# Patient Record
Sex: Male | Born: 1985 | Race: White | Hispanic: No | Marital: Single | Smoking: Current every day smoker
Health system: Southern US, Community
[De-identification: ages and names within clinical notes are randomized; demographics above are authoritative.]

## PROBLEM LIST (undated history)

## (undated) DIAGNOSIS — W3400XA Accidental discharge from unspecified firearms or gun, initial encounter: Secondary | ICD-10-CM

## (undated) DIAGNOSIS — F1110 Opioid abuse, uncomplicated: Secondary | ICD-10-CM

## (undated) DIAGNOSIS — I219 Acute myocardial infarction, unspecified: Secondary | ICD-10-CM

## (undated) DIAGNOSIS — G8929 Other chronic pain: Secondary | ICD-10-CM

## (undated) DIAGNOSIS — F32A Depression, unspecified: Secondary | ICD-10-CM

## (undated) DIAGNOSIS — F419 Anxiety disorder, unspecified: Secondary | ICD-10-CM

## (undated) DIAGNOSIS — M549 Dorsalgia, unspecified: Secondary | ICD-10-CM

## (undated) DIAGNOSIS — S71139A Puncture wound without foreign body, unspecified thigh, initial encounter: Secondary | ICD-10-CM

## (undated) DIAGNOSIS — F329 Major depressive disorder, single episode, unspecified: Secondary | ICD-10-CM

## (undated) HISTORY — PX: LEG SURGERY: SHX1003

## (undated) HISTORY — PX: WRIST SURGERY: SHX841

---

## 2014-03-04 ENCOUNTER — Encounter (HOSPITAL_COMMUNITY): Payer: Self-pay | Admitting: Emergency Medicine

## 2014-03-04 ENCOUNTER — Emergency Department (HOSPITAL_COMMUNITY)
Admission: EM | Admit: 2014-03-04 | Discharge: 2014-03-06 | Disposition: A | Discharge status: Home / Self Care | Payer: Self-pay | Attending: Emergency Medicine | Admitting: Emergency Medicine

## 2014-03-04 ENCOUNTER — Emergency Department (HOSPITAL_COMMUNITY): Payer: Self-pay

## 2014-03-04 DIAGNOSIS — F32A Depression, unspecified: Secondary | ICD-10-CM

## 2014-03-04 DIAGNOSIS — F329 Major depressive disorder, single episode, unspecified: Secondary | ICD-10-CM

## 2014-03-04 DIAGNOSIS — F1120 Opioid dependence, uncomplicated: Secondary | ICD-10-CM

## 2014-03-04 DIAGNOSIS — Z79899 Other long term (current) drug therapy: Secondary | ICD-10-CM | POA: Insufficient documentation

## 2014-03-04 DIAGNOSIS — M549 Dorsalgia, unspecified: Secondary | ICD-10-CM | POA: Insufficient documentation

## 2014-03-04 DIAGNOSIS — F1920 Other psychoactive substance dependence, uncomplicated: Secondary | ICD-10-CM

## 2014-03-04 DIAGNOSIS — G8929 Other chronic pain: Secondary | ICD-10-CM | POA: Insufficient documentation

## 2014-03-04 DIAGNOSIS — R45851 Suicidal ideations: Secondary | ICD-10-CM

## 2014-03-04 DIAGNOSIS — R4589 Other symptoms and signs involving emotional state: Secondary | ICD-10-CM

## 2014-03-04 DIAGNOSIS — F11220 Opioid dependence with intoxication, uncomplicated: Secondary | ICD-10-CM

## 2014-03-04 DIAGNOSIS — F1310 Sedative, hypnotic or anxiolytic abuse, uncomplicated: Secondary | ICD-10-CM

## 2014-03-04 DIAGNOSIS — M542 Cervicalgia: Secondary | ICD-10-CM | POA: Insufficient documentation

## 2014-03-04 DIAGNOSIS — Z720 Tobacco use: Secondary | ICD-10-CM | POA: Insufficient documentation

## 2014-03-04 DIAGNOSIS — R4689 Other symptoms and signs involving appearance and behavior: Secondary | ICD-10-CM

## 2014-03-04 DIAGNOSIS — Z87828 Personal history of other (healed) physical injury and trauma: Secondary | ICD-10-CM | POA: Insufficient documentation

## 2014-03-04 HISTORY — DX: Puncture wound without foreign body, unspecified thigh, initial encounter: S71.139A

## 2014-03-04 HISTORY — DX: Opioid abuse, uncomplicated: F11.10

## 2014-03-04 HISTORY — DX: Dorsalgia, unspecified: M54.9

## 2014-03-04 HISTORY — DX: Accidental discharge from unspecified firearms or gun, initial encounter: W34.00XA

## 2014-03-04 HISTORY — DX: Other chronic pain: G89.29

## 2014-03-04 LAB — CBC WITH DIFFERENTIAL/PLATELET
Basophils Absolute: 0 10*3/uL (ref 0.0–0.1)
Basophils Relative: 0 % (ref 0–1)
EOS PCT: 0 % (ref 0–5)
Eosinophils Absolute: 0 10*3/uL (ref 0.0–0.7)
HCT: 38.5 % — ABNORMAL LOW (ref 39.0–52.0)
HEMOGLOBIN: 13.1 g/dL (ref 13.0–17.0)
LYMPHS ABS: 1.7 10*3/uL (ref 0.7–4.0)
Lymphocytes Relative: 16 % (ref 12–46)
MCH: 30 pg (ref 26.0–34.0)
MCHC: 34 g/dL (ref 30.0–36.0)
MCV: 88.1 fL (ref 78.0–100.0)
MONOS PCT: 4 % (ref 3–12)
Monocytes Absolute: 0.4 10*3/uL (ref 0.1–1.0)
NEUTROS PCT: 80 % — AB (ref 43–77)
Neutro Abs: 8.8 10*3/uL — ABNORMAL HIGH (ref 1.7–7.7)
Platelets: 177 10*3/uL (ref 150–400)
RBC: 4.37 MIL/uL (ref 4.22–5.81)
RDW: 13.6 % (ref 11.5–15.5)
WBC: 11 10*3/uL — ABNORMAL HIGH (ref 4.0–10.5)

## 2014-03-04 LAB — RAPID URINE DRUG SCREEN, HOSP PERFORMED
Amphetamines: NOT DETECTED
BARBITURATES: NOT DETECTED
BENZODIAZEPINES: POSITIVE — AB
Cocaine: NOT DETECTED
Opiates: POSITIVE — AB
TETRAHYDROCANNABINOL: POSITIVE — AB

## 2014-03-04 LAB — COMPREHENSIVE METABOLIC PANEL
ALK PHOS: 69 U/L (ref 39–117)
ALT: 13 U/L (ref 0–53)
AST: 17 U/L (ref 0–37)
Albumin: 4.3 g/dL (ref 3.5–5.2)
Anion gap: 14 (ref 5–15)
BUN: 14 mg/dL (ref 6–23)
CO2: 25 mEq/L (ref 19–32)
Calcium: 9.4 mg/dL (ref 8.4–10.5)
Chloride: 102 mEq/L (ref 96–112)
Creatinine, Ser: 0.74 mg/dL (ref 0.50–1.35)
GLUCOSE: 121 mg/dL — AB (ref 70–99)
POTASSIUM: 4.4 meq/L (ref 3.7–5.3)
Sodium: 141 mEq/L (ref 137–147)
Total Bilirubin: 0.3 mg/dL (ref 0.3–1.2)
Total Protein: 7.1 g/dL (ref 6.0–8.3)

## 2014-03-04 LAB — URINALYSIS, ROUTINE W REFLEX MICROSCOPIC
BILIRUBIN URINE: NEGATIVE
GLUCOSE, UA: NEGATIVE mg/dL
Hgb urine dipstick: NEGATIVE
KETONES UR: NEGATIVE mg/dL
LEUKOCYTES UA: NEGATIVE
Nitrite: NEGATIVE
PROTEIN: NEGATIVE mg/dL
Specific Gravity, Urine: 1.026 (ref 1.005–1.030)
Urobilinogen, UA: 1 mg/dL (ref 0.0–1.0)
pH: 6 (ref 5.0–8.0)

## 2014-03-04 LAB — ETHANOL

## 2014-03-04 LAB — SALICYLATE LEVEL: Salicylate Lvl: <2 mg/dL — ABNORMAL LOW (ref 2.8–20.0)

## 2014-03-04 LAB — ACETAMINOPHEN LEVEL: Acetaminophen (Tylenol), Serum: <15 ug/mL (ref 10–30)

## 2014-03-04 MED ORDER — NICOTINE 21 MG/24HR TD PT24
21.0000 mg | MEDICATED_PATCH | Freq: Every day | TRANSDERMAL | Status: DC
Start: 2014-03-04 — End: 2014-03-06
  Administered 2014-03-04 – 2014-03-06 (×3): 21 mg via TRANSDERMAL
  Filled 2014-03-04 (×3): qty 1

## 2014-03-04 MED ORDER — ONDANSETRON 4 MG PO TBDP: 4.0000 mg | ORAL_TABLET | Freq: Four times a day (QID) | ORAL | Status: DC | PRN

## 2014-03-04 MED ORDER — CHLORDIAZEPOXIDE HCL 25 MG PO CAPS: 25.0000 mg | ORAL_CAPSULE | Freq: Once | ORAL | Status: DC

## 2014-03-04 MED ORDER — CHLORDIAZEPOXIDE HCL 25 MG PO CAPS: 25.0000 mg | ORAL_CAPSULE | Freq: Every day | ORAL | Status: DC

## 2014-03-04 MED ORDER — DICYCLOMINE HCL 20 MG PO TABS: 20.0000 mg | ORAL_TABLET | Freq: Four times a day (QID) | ORAL | Status: DC | PRN

## 2014-03-04 MED ORDER — CHLORDIAZEPOXIDE HCL 25 MG PO CAPS: 25.0000 mg | ORAL_CAPSULE | ORAL | Status: DC

## 2014-03-04 MED ORDER — LOPERAMIDE HCL 2 MG PO CAPS: 2.0000 mg | ORAL_CAPSULE | ORAL | Status: DC | PRN

## 2014-03-04 MED ORDER — CLONIDINE HCL 0.1 MG PO TABS
0.1000 mg | ORAL_TABLET | Freq: Four times a day (QID) | ORAL | Status: AC
  Administered 2014-03-04 – 2014-03-06 (×4): 0.1 mg via ORAL
  Filled 2014-03-04 (×4): qty 1

## 2014-03-04 MED ORDER — CHLORDIAZEPOXIDE HCL 25 MG PO CAPS
25.0000 mg | ORAL_CAPSULE | Freq: Three times a day (TID) | ORAL | Status: AC
  Administered 2014-03-05 – 2014-03-06 (×3): 25 mg via ORAL
  Filled 2014-03-04 (×3): qty 1

## 2014-03-04 MED ORDER — CHLORDIAZEPOXIDE HCL 25 MG PO CAPS
25.0000 mg | ORAL_CAPSULE | Freq: Four times a day (QID) | ORAL | Status: DC | PRN
  Administered 2014-03-06 (×2): 25 mg via ORAL
  Filled 2014-03-04 (×2): qty 1

## 2014-03-04 MED ORDER — IBUPROFEN 200 MG PO TABS
600.0000 mg | ORAL_TABLET | Freq: Three times a day (TID) | ORAL | Status: DC | PRN
  Administered 2014-03-05 – 2014-03-06 (×2): 600 mg via ORAL
  Filled 2014-03-04 (×2): qty 3

## 2014-03-04 MED ORDER — VITAMIN B-1 100 MG PO TABS
100.0000 mg | ORAL_TABLET | Freq: Every day | ORAL | Status: DC
  Administered 2014-03-05 – 2014-03-06 (×2): 100 mg via ORAL
  Filled 2014-03-04 (×2): qty 1

## 2014-03-04 MED ORDER — CLONIDINE HCL 0.1 MG PO TABS: 0.1000 mg | ORAL_TABLET | Freq: Every day | ORAL | Status: DC

## 2014-03-04 MED ORDER — CHLORDIAZEPOXIDE HCL 25 MG PO CAPS
25.0000 mg | ORAL_CAPSULE | Freq: Four times a day (QID) | ORAL | Status: AC
  Administered 2014-03-04 – 2014-03-05 (×4): 25 mg via ORAL
  Filled 2014-03-04 (×4): qty 1

## 2014-03-04 MED ORDER — THIAMINE HCL 100 MG/ML IJ SOLN
100.0000 mg | Freq: Once | INTRAMUSCULAR | Status: AC
  Administered 2014-03-04: 100 mg via INTRAMUSCULAR
  Filled 2014-03-04: qty 2

## 2014-03-04 MED ORDER — CLONIDINE HCL 0.1 MG PO TABS: 0.1000 mg | ORAL_TABLET | ORAL | Status: DC

## 2014-03-04 MED ORDER — METHOCARBAMOL 500 MG PO TABS
500.0000 mg | ORAL_TABLET | Freq: Three times a day (TID) | ORAL | Status: DC | PRN
  Administered 2014-03-05 – 2014-03-06 (×2): 500 mg via ORAL
  Filled 2014-03-04 (×2): qty 1

## 2014-03-04 MED ORDER — ADULT MULTIVITAMIN W/MINERALS CH
1.0000 | ORAL_TABLET | Freq: Every day | ORAL | Status: DC
  Administered 2014-03-04 – 2014-03-06 (×4): 1 via ORAL
  Filled 2014-03-04 (×3): qty 1

## 2014-03-04 NOTE — ED Notes (Signed)
Patient currently speaking with pharmacy staff. Will initiate assessment afterwards.    Colton ColonelGregory Pickett Jr. MSW, LCSW Therapeutic Triage Services-Triage Specialist   Phone: [phone] Fax: [phone]

## 2014-03-04 NOTE — ED Notes (Addendum)
Patient belongings: Pants, phone, socks,  shirt, jacket and wallet in pocket zipped up, slide shoes, ring in urine cup and hat.
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
## 2014-03-04 NOTE — ED Notes (Signed)
Dr Jama Flavorscobos and Catha Nottinghamjamison into see
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
## 2014-03-04 NOTE — ED Notes (Signed)
Up to the bathroom 
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
## 2014-03-04 NOTE — ED Notes (Signed)
[facility] Expected date:  Expected time:  Means of arrival:  Comments: Hold for triage 5
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
## 2014-03-05 ENCOUNTER — Encounter (HOSPITAL_COMMUNITY): Payer: Self-pay | Admitting: Registered Nurse

## 2014-03-05 DIAGNOSIS — F1120 Opioid dependence, uncomplicated: Secondary | ICD-10-CM

## 2014-03-05 DIAGNOSIS — F1320 Sedative, hypnotic or anxiolytic dependence, uncomplicated: Secondary | ICD-10-CM

## 2014-03-05 DIAGNOSIS — F1920 Other psychoactive substance dependence, uncomplicated: Secondary | ICD-10-CM

## 2014-03-05 NOTE — ED Provider Notes (Signed)
Medical screening examination/treatment/procedure(s) were performed by non-physician practitioner and as supervising physician I was immediately available for consultation/collaboration.   EKG Interpretation   Date/Time:  Saturday March 04 2014 09:50:53 EDT Ventricular Rate:  57 PR Interval:  141 QRS Duration: 86 QT Interval:  431 QTC Calculation: 420 R Axis:   86 Text Interpretation:  Sinus rhythm Baseline wander in lead(s) V3 V4 V5 V6  No previous tracing Confirmed by Anitra LauthPLUNKETT  MD, Chaunice Obie ([id number]) on  03/04/2014 10:05:18 AM        Gwyneth SproutWhitney Richel Millspaugh, MD 03/05/14 1601

## 2014-03-05 NOTE — ED Notes (Signed)
Dr Jama Flavorscobos and shuvon into see

## 2014-03-05 NOTE — Progress Notes (Signed)
MHT contacted the following inpatient treatment facilities for placement:  FAXED REFERRALS  1)[facility]  2)[place]  [facility] [facility]  [facility]  1)[facility]  2)[place]  3)[place]  4)[place]  5)[facility]  6)[place]  7)[place]  8)[facility]  9)[facility]    Blain PaisMichelle L Hanad Leino, MHT/NS

## 2014-03-05 NOTE — ED Notes (Addendum)
Up on the phone, pt requesting to speak to the MD in concerning to his detox from the [facility]

## 2014-03-05 NOTE — ED Notes (Addendum)
PO fluids encouraged.  Pt declines medication at this time, instructed to notify staff is abd cramping/diarrhea occurrs.  NP is aware of pt's request to speak to MD/NP concering detox and will see

## 2014-03-05 NOTE — ED Notes (Signed)
Dr Jeraldine LootsLockwood informed of BP/pulse
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
## 2014-03-05 NOTE — ED Notes (Signed)
Patient presents depressed and irritable with blunted affect. Denies any current thoughts of self harm or thoughts to hurt others, denies AVH. NAD

## 2014-03-06 ENCOUNTER — Encounter (HOSPITAL_COMMUNITY): Payer: Self-pay | Admitting: Behavioral Health

## 2014-03-06 ENCOUNTER — Inpatient Hospital Stay (HOSPITAL_COMMUNITY)
Admission: AD | Admit: 2014-03-06 | Discharge: 2014-03-12 | DRG: 885 | Disposition: A | Discharge status: Home / Self Care | Payer: Federal, State, Local not specified - Other | Source: Intra-hospital | Attending: Psychiatry | Admitting: Psychiatry

## 2014-03-06 DIAGNOSIS — R45851 Suicidal ideations: Secondary | ICD-10-CM | POA: Diagnosis present

## 2014-03-06 DIAGNOSIS — G4710 Hypersomnia, unspecified: Secondary | ICD-10-CM | POA: Diagnosis present

## 2014-03-06 DIAGNOSIS — F17210 Nicotine dependence, cigarettes, uncomplicated: Secondary | ICD-10-CM | POA: Diagnosis present

## 2014-03-06 DIAGNOSIS — F1910 Other psychoactive substance abuse, uncomplicated: Secondary | ICD-10-CM | POA: Diagnosis present

## 2014-03-06 DIAGNOSIS — F4310 Post-traumatic stress disorder, unspecified: Secondary | ICD-10-CM

## 2014-03-06 DIAGNOSIS — F1193 Opioid use, unspecified with withdrawal: Secondary | ICD-10-CM

## 2014-03-06 DIAGNOSIS — Z599 Problem related to housing and economic circumstances, unspecified: Secondary | ICD-10-CM

## 2014-03-06 DIAGNOSIS — G4700 Insomnia, unspecified: Secondary | ICD-10-CM | POA: Diagnosis present

## 2014-03-06 DIAGNOSIS — F1290 Cannabis use, unspecified, uncomplicated: Secondary | ICD-10-CM | POA: Diagnosis present

## 2014-03-06 DIAGNOSIS — F410 Panic disorder [episodic paroxysmal anxiety] without agoraphobia: Secondary | ICD-10-CM | POA: Diagnosis present

## 2014-03-06 DIAGNOSIS — F1123 Opioid dependence with withdrawal: Secondary | ICD-10-CM | POA: Diagnosis present

## 2014-03-06 DIAGNOSIS — G8929 Other chronic pain: Secondary | ICD-10-CM | POA: Diagnosis present

## 2014-03-06 DIAGNOSIS — F332 Major depressive disorder, recurrent severe without psychotic features: Secondary | ICD-10-CM | POA: Diagnosis present

## 2014-03-06 DIAGNOSIS — F1994 Other psychoactive substance use, unspecified with psychoactive substance-induced mood disorder: Secondary | ICD-10-CM

## 2014-03-06 DIAGNOSIS — F1320 Sedative, hypnotic or anxiolytic dependence, uncomplicated: Secondary | ICD-10-CM

## 2014-03-06 DIAGNOSIS — F1120 Opioid dependence, uncomplicated: Secondary | ICD-10-CM

## 2014-03-06 DIAGNOSIS — Z569 Unspecified problems related to employment: Secondary | ICD-10-CM | POA: Diagnosis present

## 2014-03-06 MED ORDER — ALUM & MAG HYDROXIDE-SIMETH 200-200-20 MG/5ML PO SUSP: 30.0000 mL | ORAL | Status: DC | PRN

## 2014-03-06 MED ORDER — DICYCLOMINE HCL 20 MG PO TABS
20.0000 mg | ORAL_TABLET | Freq: Four times a day (QID) | ORAL | Status: AC | PRN
  Administered 2014-03-07: 20 mg via ORAL
  Filled 2014-03-06: qty 1

## 2014-03-06 MED ORDER — METHOCARBAMOL 500 MG PO TABS
500.0000 mg | ORAL_TABLET | Freq: Three times a day (TID) | ORAL | Status: DC | PRN
  Administered 2014-03-07 – 2014-03-09 (×5): 500 mg via ORAL
  Filled 2014-03-06 (×5): qty 1

## 2014-03-06 MED ORDER — ACETAMINOPHEN 325 MG PO TABS
650.0000 mg | ORAL_TABLET | Freq: Four times a day (QID) | ORAL | Status: DC | PRN
  Administered 2014-03-09 – 2014-03-10 (×2): 650 mg via ORAL
  Filled 2014-03-06 (×2): qty 2

## 2014-03-06 MED ORDER — ONDANSETRON 4 MG PO TBDP: 4.0000 mg | ORAL_TABLET | Freq: Four times a day (QID) | ORAL | Status: AC | PRN

## 2014-03-06 MED ORDER — NAPROXEN 500 MG PO TABS
500.0000 mg | ORAL_TABLET | Freq: Two times a day (BID) | ORAL | Status: AC | PRN
  Administered 2014-03-06 – 2014-03-11 (×9): 500 mg via ORAL
  Filled 2014-03-06 (×9): qty 1

## 2014-03-06 MED ORDER — DIPHENHYDRAMINE HCL 50 MG PO CAPS
50.0000 mg | ORAL_CAPSULE | Freq: Every evening | ORAL | Status: DC | PRN
  Administered 2014-03-06 – 2014-03-11 (×10): 50 mg via ORAL
  Filled 2014-03-06: qty 2
  Filled 2014-03-06 (×3): qty 1
  Filled 2014-03-06 (×4): qty 2
  Filled 2014-03-06: qty 1
  Filled 2014-03-06: qty 2
  Filled 2014-03-06 (×10): qty 1
  Filled 2014-03-06: qty 2
  Filled 2014-03-06: qty 1

## 2014-03-06 MED ORDER — CLONIDINE HCL 0.1 MG PO TABS
0.1000 mg | ORAL_TABLET | Freq: Every day | ORAL | Status: DC
  Filled 2014-03-06 (×2): qty 1

## 2014-03-06 MED ORDER — ACETAMINOPHEN 325 MG PO TABS: 650.0000 mg | ORAL_TABLET | Freq: Four times a day (QID) | ORAL | Status: DC | PRN

## 2014-03-06 MED ORDER — CHLORDIAZEPOXIDE HCL 25 MG PO CAPS
25.0000 mg | ORAL_CAPSULE | Freq: Four times a day (QID) | ORAL | Status: DC | PRN
  Administered 2014-03-07 – 2014-03-08 (×2): 25 mg via ORAL
  Filled 2014-03-06 (×3): qty 1

## 2014-03-06 MED ORDER — LOPERAMIDE HCL 2 MG PO CAPS
2.0000 mg | ORAL_CAPSULE | ORAL | Status: AC | PRN
  Administered 2014-03-07: 2 mg via ORAL
  Administered 2014-03-07: 4 mg via ORAL
  Filled 2014-03-06: qty 1
  Filled 2014-03-06: qty 2

## 2014-03-06 MED ORDER — CLONIDINE HCL 0.1 MG PO TABS
0.1000 mg | ORAL_TABLET | ORAL | Status: AC
  Administered 2014-03-09 – 2014-03-10 (×2): 0.1 mg via ORAL
  Filled 2014-03-06 (×4): qty 1

## 2014-03-06 MED ORDER — CLONIDINE HCL 0.1 MG PO TABS
0.1000 mg | ORAL_TABLET | Freq: Four times a day (QID) | ORAL | Status: AC
  Administered 2014-03-06 – 2014-03-09 (×9): 0.1 mg via ORAL
  Filled 2014-03-06 (×12): qty 1

## 2014-03-06 MED ORDER — MAGNESIUM HYDROXIDE 400 MG/5ML PO SUSP
30.0000 mL | Freq: Every day | ORAL | Status: DC | PRN
Start: 2014-03-06 — End: 2014-03-12

## 2014-03-06 MED ORDER — MAGNESIUM HYDROXIDE 400 MG/5ML PO SUSP: 30.0000 mL | Freq: Every day | ORAL | Status: DC | PRN

## 2014-03-06 MED ORDER — CLONIDINE HCL 0.1 MG PO TABS: 0.1000 mg | ORAL_TABLET | Freq: Three times a day (TID) | ORAL | Status: DC | PRN

## 2014-03-06 NOTE — ED Notes (Addendum)
States "felt calmer".

## 2014-03-06 NOTE — BH Assessment (Addendum)
[facility] by Jannifer FranklinAkintayo, MD and Renata Capriceonrad, NP. The room assignment is 306-2. [facility].
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
## 2014-03-06 NOTE — BH Assessment (Addendum)
[facility] per Chief Financial OfficerDarlene Nurse coordinator at [facility]. Pt Accepting doctor is Dr.Sands. Pt accepted to bed 2588-2.  Nursing reports telephone number is [phone].   Per Julieanne Cottonina AC [facility] when [facility]. Per Inetta Fermoina there will be potential discharges and a bed should be available after 3pm.  Notified April[facility] to inform her that patient has been placed elsewhere as instructed per [facility] AC.   Colton Kelly has been notified of this.   Glorious PeachNajah Tyrone Balash, MS, LCASA Assessment Counselor

## 2014-03-06 NOTE — ED Notes (Signed)
C/o withdrawal. Librium requested and received.

## 2014-03-06 NOTE — ED Notes (Signed)
[employer] called to inquire if pt is still in department.  RN called at change of shift to inquire about hold up for transport.  Pending [employer] transport.  Pt is IVC.
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
## 2014-03-06 NOTE — ED Notes (Signed)
C/o anxiety. Requested and received librium 25mg .

## 2014-03-06 NOTE — ED Notes (Signed)
[facility] at bedside to transport pt to [facility] 

## 2014-03-06 NOTE — ED Notes (Signed)
[employer] called back to confirm dispatch for transport.  Dispatch confirmed request.

## 2014-03-07 ENCOUNTER — Encounter (HOSPITAL_COMMUNITY): Payer: Self-pay | Admitting: Psychiatry

## 2014-03-07 DIAGNOSIS — F1320 Sedative, hypnotic or anxiolytic dependence, uncomplicated: Secondary | ICD-10-CM | POA: Diagnosis present

## 2014-03-07 DIAGNOSIS — R45851 Suicidal ideations: Secondary | ICD-10-CM

## 2014-03-07 DIAGNOSIS — F332 Major depressive disorder, recurrent severe without psychotic features: Secondary | ICD-10-CM | POA: Diagnosis present

## 2014-03-07 DIAGNOSIS — F4310 Post-traumatic stress disorder, unspecified: Secondary | ICD-10-CM | POA: Diagnosis present

## 2014-03-07 DIAGNOSIS — F1120 Opioid dependence, uncomplicated: Secondary | ICD-10-CM | POA: Diagnosis present

## 2014-03-07 DIAGNOSIS — F1994 Other psychoactive substance use, unspecified with psychoactive substance-induced mood disorder: Secondary | ICD-10-CM

## 2014-03-07 DIAGNOSIS — F1193 Opioid use, unspecified with withdrawal: Secondary | ICD-10-CM | POA: Diagnosis present

## 2014-03-07 MED ORDER — CHLORDIAZEPOXIDE HCL 25 MG PO CAPS: 25.0000 mg | ORAL_CAPSULE | Freq: Three times a day (TID) | ORAL | Status: DC

## 2014-03-07 MED ORDER — ENSURE COMPLETE PO LIQD
237.0000 mL | Freq: Two times a day (BID) | ORAL | Status: DC
  Administered 2014-03-07 – 2014-03-12 (×12): 237 mL via ORAL

## 2014-03-07 MED ORDER — CHLORDIAZEPOXIDE HCL 25 MG PO CAPS
25.0000 mg | ORAL_CAPSULE | Freq: Four times a day (QID) | ORAL | Status: AC
  Administered 2014-03-07 – 2014-03-08 (×4): 25 mg via ORAL
  Filled 2014-03-07 (×4): qty 1

## 2014-03-07 MED ORDER — CHLORDIAZEPOXIDE HCL 25 MG PO CAPS: 25.0000 mg | ORAL_CAPSULE | Freq: Every day | ORAL | Status: DC

## 2014-03-07 MED ORDER — NICOTINE 21 MG/24HR TD PT24
21.0000 mg | MEDICATED_PATCH | Freq: Every day | TRANSDERMAL | Status: DC
  Administered 2014-03-07 – 2014-03-12 (×6): 21 mg via TRANSDERMAL
  Filled 2014-03-07 (×8): qty 1

## 2014-03-07 MED ORDER — ADULT MULTIVITAMIN W/MINERALS CH
1.0000 | ORAL_TABLET | Freq: Every day | ORAL | Status: DC
  Administered 2014-03-07 – 2014-03-12 (×6): 1 via ORAL
  Filled 2014-03-07 (×10): qty 1

## 2014-03-07 MED ORDER — CHLORDIAZEPOXIDE HCL 25 MG PO CAPS: 25.0000 mg | ORAL_CAPSULE | ORAL | Status: DC

## 2014-03-07 MED ORDER — GABAPENTIN 300 MG PO CAPS
300.0000 mg | ORAL_CAPSULE | Freq: Three times a day (TID) | ORAL | Status: DC
  Administered 2014-03-07 – 2014-03-09 (×6): 300 mg via ORAL
  Filled 2014-03-07 (×10): qty 1
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
## 2014-03-07 NOTE — [facility] Group Notes (Signed)
The focus of this group is to educate the patient on the purpose and policies of crisis stabilization and provide a format to answer questions about their admission.  The group details unit policies and expectations of patients while admitted.  Patient did not attend 0900 nurse education orientation group this morning.  Patient stayed in bed.   
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
## 2014-03-07 NOTE — [facility] Group Notes (Signed)
[facility] LCSW Group Therapy  Feelings Around Diagnosis 1:15 -  2:30 PM  03/07/2014 3:00 PM  Type of Therapy:  Group Therapy  Participation Level:  Did Not Attend    Wynn BankerHodnett, Eowyn Tabone Hairston 03/07/2014, 3:00 PM

## 2014-03-07 NOTE — Progress Notes (Signed)
D:Patient in the bed on approach.  Patient state she has been in bed all day.  Patient states he has been having withdrawal symptoms.  Patient states the medications he is on have been helping him.  Patient states he is passive SI but verbally contracts for safety.  Patient denies HI/AVH. A: Staff to monitor Q 15 mins for safety.  Encouragement and support offered.  Scheduled medications administered per orders.  Bentyl and naproxen administered prn R: Patient remains safe on the unit.  Patient did not attend group tonight.  Patient not visible on the unit and not interacting with peers.  Patient taking administered medications.

## 2014-03-07 NOTE — [facility] Group Notes (Signed)
Adult Psychoeducational Group Note  Date:  03/07/2014 Time:  10:19 PM  Group Topic/Focus:  Wrap-Up Group:   The focus of this group is to help patients review their daily goal of treatment and discuss progress on daily workbooks.  Participation Level:  Did Not Attend  Participation Quality:  None  Affect:  None  Cognitive:  None  Insight: None  Engagement in Group:  None  Modes of Intervention:  Discussion  Additional Comments:  Samyak did not attend group.  Caroll RancherLindsay, Demetrice Combes A 03/07/2014, 10:19 PM
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
## 2014-03-07 NOTE — Progress Notes (Addendum)
1130  Patient laying in bed.  Stated he does have SI thoughts, no plan, contracts for safety.  Denied HI.  Denied A/V hallucinations.  Patient stated he talked to SW.  Patient stated earlier this morning that he was not suicidal.  Safety maintained with 15 minute checks.  Respirations even and unlabored.  No signs/symptoms of pain/distress noted on patient's face or body movements.  1445  Patient has been sleeping in bed today.    1530  Patient's self inventory sheet, patient has poor sleep, sleep medication was not helpfl.  Poor appetite, low energy level, poor concentration.  Rated depression, hopeless, anxiety #10.  Has experienced tremors, diarrhea, chilling, craving, irritability in the past 24 hours.  SI occasionally, contracts for safety.  Has experienced body aches in past 24 hours.  Worst pain #10 for body aches/withdrawals.  Goal today is to "feel good".  No discharge plan.  Does not anticipate any problems after discharge.  1700  Patient stated he wants to get better and have a good life with his 30 year old son.  That he has lost his job and now lives in [place].  His girlfriend would drive him to the methadone clinic in [place].  Patient tearful and wants to get lead a productive life for the sake of his small child.  Emotional support and encouragement given patient.

## 2014-03-07 NOTE — Progress Notes (Signed)
NUTRITION ASSESSMENT  Pt identified as at risk on the Malnutrition Screen Tool  INTERVENTION: 1. Educated patient on the importance of nutrition and encouraged intake of food and beverages. 2. Discussed weight goals. 3. Supplements: Provide Ensure Complete po BID, each supplement provides 350 kcal and 13 grams of protein   NUTRITION DIAGNOSIS: Unintentional weight loss related to sub-optimal intake as evidenced by pt report.   Goal: Pt to meet >/= 90% of their estimated nutrition needs.  Monitor:  PO intake  Assessment:  28 y.o. male patient admitted with Severe depression in the context of severe psychosocial stressors and substance dependence.  Pt reports 30 lb weight loss over the last 3 weeks, UBW of 165-170 lb. Current wt is 143 lb (16% wt loss x 3 weeks), this is significant for time frame. Pt states that he has had no appetite for the last couple of weeks. He does not eat regular meals and if he does eat, he only eats a few bites.   Discussed with pt the importance of eating 3 meals a day with snacks, emphasizing protein consumption. Discussed the importance of good nutrition for mental health and aiding in recovery.   Pt is at nutritional risk d/t Hx of alcohol abuse, poor PO intake x 1 month and underweight status.   Height: Ht Readings from Last 1 Encounters:  03/06/14 6\' 4"  (1.93 m)    Weight: Wt Readings from Last 1 Encounters:  03/06/14 143 lb (64.864 kg)    Weight Hx: Wt Readings from Last 10 Encounters:  03/06/14 143 lb (64.864 kg)    BMI:  Body mass index is 17.41 kg/(m^2). Pt meets criteria for underweight based on current BMI.  Estimated Nutritional Needs: Kcal: 25-30 kcal/kg Protein: > 1 gram protein/kg Fluid: 1 ml/kcal  Diet Order: General Pt is also offered choice of unit snacks mid-morning and mid-afternoon.  Pt is eating as desired.   Lab results and medications reviewed.  Glucose 121  Tilda FrancoLindsey [place], [place], [place], [place] Pager: [phone] After  Hours Pager: [phone]
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
## 2014-03-07 NOTE — Progress Notes (Signed)
Recreation Therapy Notes  Animal-Assisted Activity/Therapy (AAA/T) Program Checklist/Progress Notes Patient Eligibility Criteria Checklist & Daily Group note for Rec Tx Intervention  Date: 10.20.2015 Time: 2:45pm Location: [address] Dayroom    AAA/T Program Assumption of Risk Form signed by Patient/ or Parent Legal Guardian yes  Patient is free of allergies or sever asthma yes  Patient reports no fear of animals yes  Patient reports no history of cruelty to animals yes   Patient understands his/her participation is voluntary yes  Behavioral Response: Did not attend.   Marykay Lexenise L Peace Jost, LRT/CTRS  Ludy Messamore L 03/07/2014 4:06 PM

## 2014-03-08 MED ORDER — LORAZEPAM 0.5 MG PO TABS: 0.5000 mg | ORAL_TABLET | Freq: Two times a day (BID) | ORAL | Status: DC

## 2014-03-08 MED ORDER — LORAZEPAM 1 MG PO TABS: 1.0000 mg | ORAL_TABLET | Freq: Four times a day (QID) | ORAL | Status: DC

## 2014-03-08 MED ORDER — LORAZEPAM 1 MG PO TABS
1.0000 mg | ORAL_TABLET | Freq: Four times a day (QID) | ORAL | Status: AC
  Administered 2014-03-08 – 2014-03-09 (×7): 1 mg via ORAL
  Filled 2014-03-08 (×7): qty 1

## 2014-03-08 MED ORDER — LORAZEPAM 1 MG PO TABS
1.0000 mg | ORAL_TABLET | Freq: Four times a day (QID) | ORAL | Status: DC | PRN
  Administered 2014-03-08 – 2014-03-12 (×9): 1 mg via ORAL
  Filled 2014-03-08 (×11): qty 1

## 2014-03-08 MED ORDER — ZOLPIDEM TARTRATE 10 MG PO TABS
10.0000 mg | ORAL_TABLET | Freq: Every evening | ORAL | Status: DC | PRN
  Administered 2014-03-08: 10 mg via ORAL
  Filled 2014-03-08: qty 1

## 2014-03-08 MED ORDER — LORAZEPAM 0.5 MG PO TABS: 0.5000 mg | ORAL_TABLET | Freq: Every day | ORAL | Status: DC

## 2014-03-08 MED ORDER — LORAZEPAM 1 MG PO TABS
1.0000 mg | ORAL_TABLET | Freq: Three times a day (TID) | ORAL | Status: DC
  Administered 2014-03-10 – 2014-03-12 (×8): 1 mg via ORAL
  Filled 2014-03-08 (×6): qty 1

## 2014-03-08 MED ORDER — LORAZEPAM 1 MG PO TABS: 1.0000 mg | ORAL_TABLET | Freq: Two times a day (BID) | ORAL | Status: DC

## 2014-03-08 NOTE — Progress Notes (Signed)
Pt attended NA group this evening.  

## 2014-03-08 NOTE — Progress Notes (Signed)
D: Patient is alert and oriented. Pt's mood is depressed and anxious. Pt's affect is flat. Pt states he feels very badly today and complains of entire body aches. Pt states his goal today is to "get better" by resting and "laying in bed." Pt states he is not attending groups because he feels bad physically and expresses fear of getting a panic attack in group. Pt reports thoughts of SI but denies HI and AVH at this time. Pt reports decreased appetite. Pt reports he wants long term treatment [facility]. A: PRN medication administered per providers orders for pain, muscle spasms, and anxiety. Medications administered per providers orders. 15 minute checks completed per protocol for patient safety. Encouragement provided to pt in regards to attending at least one group today. R: Pt is cooperative and receptive to nursing interventions. Pt verbally contracts for safety.

## 2014-03-08 NOTE — [facility] Group Notes (Signed)
[facility] Dis[facility] Planning [facility] Note   03/08/2014 9:31 AM    Participation Quality:  Did not attend group.  Gyan Cambre, Joesph JulyQuylle Hairston

## 2014-03-08 NOTE — Plan of Care (Signed)
Problem: Alteration in mood; excessive anxiety as evidenced by: Goal: STG-Pt can identify how behaviors/thoughts can (Patient can identify how behaviors/thoughts can increase and decrease anxiety)  Outcome: Progressing Pt reports large groups make him feels anxious and increases his fear of having a panic attack.

## 2014-03-08 NOTE — Progress Notes (Signed)
D: Patient observed laying in bed tearful. Pt reports he is unable to sit up in bed. Pt states "something feels wrong." Pt reports headache and back pain 10/10. Pt appears anxious. Pt reports "seeing black spots." Speech clear, thoughts coherent. A: Vitals taken by RN and stable. Fransisca KaufmannLaura Davis, NP informed of pts current condition and complaints. Reassurance provided to pt. PRN medications administered per providers orders for anxiety and pain. Will continue to monitor closely. R: Pt receptive and cooperative to nursing interventions. Pt laying in bed. Pt states "I know this is withdrawals."

## 2014-03-08 NOTE — Progress Notes (Signed)
[facility]  03/08/2014 12:20 PM Colton LienJohn Kelly  MRN:  [id number] Subjective:  Colton Kelly is having a hard time. He states he did not sleep last night. He "stared at the wall". Still having diarrhea as well as nausea, aches, pains States he is dealing with a lot of anxiety. He has the picture of his son in front of him, states to give him strength to go trough this. Feels that he is dealing with the detox as well as the mood instability and the anxiety he has been dealing on for years Diagnosis:   DSM5: Trauma-Stressor Disorders:  Posttraumatic Stress Disorder (309.81) Substance/Addictive Disorders:  Opioid Disorder - Severe (304.00), Benzodiazepine related disorder Depressive Disorders:  Major Depressive Disorder - Severe (296.23) Total Time spent with patient: 30 minutes  Axis I: Substance Induced Mood Disorder  ADL's:  Intact  Sleep: Poor  Appetite:  Poor Psychiatric Specialty Exam: Physical Exam  Review of Systems  Constitutional: Positive for malaise/fatigue and diaphoresis.  Respiratory: Negative.   Cardiovascular: Negative.   Gastrointestinal: Positive for nausea, abdominal pain and diarrhea.  Genitourinary: Negative.   Musculoskeletal: Positive for back pain, joint pain and myalgias.  Skin: Negative.   Neurological: Positive for weakness and headaches.  Endo/Heme/Allergies: Negative.   Psychiatric/Behavioral: Positive for depression and substance abuse. The patient is nervous/anxious and has insomnia.     Blood pressure 92/51, pulse 59, temperature 98.7 F (37.1 C), temperature source Oral, resp. rate 18, height 6\' 4"  (1.93 m), weight 64.864 kg (143 lb).Body mass index is 17.41 kg/(m^2).  General Appearance: Disheveled, curl on the bed   Eye Contact::  Poor  Speech:  Clear and Coherent  Volume:  Normal  Mood:  Anxious, Depressed and Dysphoric  Affect:  Restricted  Thought Process:  Coherent and Goal Directed  Orientation:  Full (Time, Place, and Person)  Thought  Content:  symtpoms events worries concerns  Suicidal Thoughts:  No  Homicidal Thoughts:  No  Memory:  Immediate;   Fair Recent;   Fair Remote;   Fair  Judgement:  Fair  Insight:  Present  Psychomotor Activity:  Restlessness  Concentration:  Fair  Recall:  FiservFair  Fund of Knowledge:NA  Language: Fair  Akathisia:  No  Handed:    AIMS (if indicated):     Assets:  Desire for Improvement  Sleep:  Number of Hours: 5.75   Musculoskeletal: Strength & Muscle Tone: within normal limits Gait & Station: normal Patient leans: N/A  Current Medications: Current Facility-Administered Medications  Medication Dose Route Frequency Provider Last Rate Last Dose  . acetaminophen (TYLENOL) tablet 650 mg  650 mg Oral Q6H PRN Kerry HoughSpencer E Simon, PA-C      . alum & mag hydroxide-simeth (MAALOX/MYLANTA) 200-200-20 MG/5ML suspension 30 mL  30 mL Oral Q4H PRN Kerry HoughSpencer E Simon, PA-C      . cloNIDine (CATAPRES) tablet 0.1 mg  0.1 mg Oral QID Kerry HoughSpencer E Simon, PA-C   0.1 mg at 03/08/14 [id number]   Followed by  . [START ON 03/09/2014] cloNIDine (CATAPRES) tablet 0.1 mg  0.1 mg Oral BH-qamhs Spencer E Simon, PA-C       Followed by  . [START ON 03/12/2014] cloNIDine (CATAPRES) tablet 0.1 mg  0.1 mg Oral QAC breakfast Kerry HoughSpencer E Simon, PA-C      . dicyclomine (BENTYL) tablet 20 mg  20 mg Oral Q6H PRN Kerry HoughSpencer E Simon, PA-C   20 mg at 03/07/14 2144  . diphenhydrAMINE (BENADRYL) capsule 50 mg  50 mg Oral QHS,MR  X 1 Kerry Hough, PA-C   50 mg at 03/07/14 2311  . feeding supplement (ENSURE COMPLETE) (ENSURE COMPLETE) liquid 237 mL  237 mL Oral BID BM Tilda Franco, RD   237 mL at 03/08/14 0957  . gabapentin (NEURONTIN) capsule 300 mg  300 mg Oral TID Rachael Fee, MD   300 mg at 03/08/14 1149  . loperamide (IMODIUM) capsule 2-4 mg  2-4 mg Oral PRN Kerry Hough, PA-C   2 mg at 03/07/14 2311  . LORazepam (ATIVAN) tablet 1 mg  1 mg Oral QID Rachael Fee, MD   1 mg at 03/08/14 1213   Followed by  . [START ON 03/10/2014]  LORazepam (ATIVAN) tablet 1 mg  1 mg Oral TID Rachael Fee, MD       Followed by  . [START ON 03/13/2014] LORazepam (ATIVAN) tablet 1 mg  1 mg Oral BID Rachael Fee, MD       Followed by  . [START ON 03/16/2014] LORazepam (ATIVAN) tablet 0.5 mg  0.5 mg Oral BID Rachael Fee, MD       Followed by  . [START ON 03/18/2014] LORazepam (ATIVAN) tablet 0.5 mg  0.5 mg Oral Daily Rachael Fee, MD      . LORazepam (ATIVAN) tablet 1 mg  1 mg Oral Q6H PRN Rachael Fee, MD      . magnesium hydroxide (MILK OF MAGNESIA) suspension 30 mL  30 mL Oral Daily PRN Kerry Hough, PA-C      . methocarbamol (ROBAXIN) tablet 500 mg  500 mg Oral Q8H PRN Kerry Hough, PA-C   500 mg at 03/08/14 1610  . multivitamin with minerals tablet 1 tablet  1 tablet Oral Daily Rachael Fee, MD   1 tablet at 03/08/14 0827  . naproxen (NAPROSYN) tablet 500 mg  500 mg Oral BID PRN Kerry Hough, PA-C   500 mg at 03/08/14 0827  . nicotine (NICODERM CQ - dosed in mg/24 hours) patch 21 mg  21 mg Transdermal Daily Rachael Fee, MD   21 mg at 03/08/14 0831  . ondansetron (ZOFRAN-ODT) disintegrating tablet 4 mg  4 mg Oral Q6H PRN Kerry Hough, PA-C      . zolpidem (AMBIEN) tablet 10 mg  10 mg Oral QHS PRN Rachael Fee, MD        Lab Results: No results found for this or any previous visit (from the past 48 hour(s)).  Physical Findings: AIMS: Facial and Oral Movements Muscles of Facial Expression: None, normal Lips and Perioral Area: None, normal Jaw: None, normal Tongue: None, normal,Extremity Movements Upper (arms, wrists, hands, fingers): None, normal Lower (legs, knees, ankles, toes): None, normal, Trunk Movements Neck, shoulders, hips: None, normal, Overall Severity Severity of abnormal movements (highest score from questions above): None, normal Incapacitation due to abnormal movements: None, normal Patient's awareness of abnormal movements (rate only patient's report): No Awareness, Dental Status Current  problems with teeth and/or dentures?: No Does patient usually wear dentures?: No  CIWA:  CIWA-Ar Total: 10 COWS:  COWS Total Score: 8  Treatment Plan Summary: Daily contact with patient to assess and evaluate symptoms and progress in treatment Medication management  Plan: Supportive approach/coping skills/relapse prevention           Trial with Ativan detox with a extended taper            WIl use Ambien that he has found effective (cant take Trazodone)  Continue to assess and address the co morbities  Medical Decision Making Problem Points:  Review of psycho-social stressors (1) Data Points:  Review of medication regiment & side effects (2) Review of new medications or change in dosage (2)  I certify that inpatient services furnished can reasonably be expected to improve the patient's condition.   Diva Lemberger A 03/08/2014, 12:20 PM

## 2014-03-08 NOTE — [facility] Group Notes (Signed)
[facility] LCSW Group Therapy      Emotional Regulation 1:15 - 2:30 PM         03/08/2014 2:48 PM  Type of Therapy:  Group Therapy  Participation Level:  Did Not Attend - patient in bed.  03/08/2014, 2:48 PM

## 2014-03-08 NOTE — Clinical Social Work Note (Signed)
CSW contacted Laren EvertsJan Parrish at [facility] in [place]  She advised there is no funding source available for residential treatment in [place].  She shared they have a five day contract with [facility] for detox.  She also advised of there being a mission center for the homeless in [place] that provide treatment but they only accept new admission four times yearly and they last open for services in August.  She shared there is [facility] in [place], an eight bed program but they generally have a wait list.  She [facility].  CSW explained patient is not homeless but is in need of direct admission into a treatment program.  She shared these are the only options she can recommend.  CSW spoke with patient's mother to see if there were financial resources available to assist with treatment.  She advised of being very supportive and that she pays for patient and his fiance to go to the methadone clinic.

## 2014-03-08 NOTE — [facility] Suicide Risk Assessment (Signed)
[facility] INPATIENT:  Family/Significant Other Suicide Prevention Education  Suicide Prevention Education:  Education Completed; Ricki RodriguezRonda Shelton, Mother, [phone]; has been identified by the patient as the family member/significant other with whom the patient will be residing, and identified as the person(s) who will aid the patient in the event of a mental health crisis (suicidal ideations/suicide attempt).  With written consent from the patient, the family member/significant other has been provided the following suicide prevention education, prior to the and/or following the discharge of the patient.  Mother advised of being very familiar with SPE as her husband, who was veteran, killed himself.  The suicide prevention education provided includes the following:  Suicide risk factors  Suicide prevention and interventions  National Suicide Hotline telephone number  [facility] assessment telephone number  [facility] Emergency Assistance 911  [facility] and/or Residential Mobile Crisis Unit telephone number  Request made of family/significant other to:  Remove weapons (e.g., guns, rifles, knives), all items previously/currently identified as safety concern.   Mother advised there are guns in the home but they are locked in a safe and patient does not have access to the key.   Remove drugs/medications (over-the-counter, prescriptions, illicit drugs), all items previously/currently identified as a safety concern.  The family member/significant other verbalizes understanding of the suicide prevention education information provided.  The family member/significant other agrees to remove the items of safety concern listed above.  Wynn BankerHodnett, Skyler Dusing Hairston 03/08/2014, 2:35 PM

## 2014-03-08 NOTE — Progress Notes (Signed)
D: Patient in the hallway on approach.  Patient state she had a horrible day.  Patient states he continues to to through withdrawals.  Patient states he has not been going to the cafeteria but states he has been eating meals on the unit. Patient did have a period tonight where he appeared drowsy and he stated he did not feel well.  VS were taken and were normal.  Patient was given medications fluids to drink and he stated he felt better.  Patient states he is passive SI but verbally contracts for safety.  Patient denies HI/AVH. A: Staff to monitor Q 15 mins for safety.  Encouragement and support offered.  Scheduled medications administered per orders. Ambien administered prn for sleep. R: Patient remains safe on the unit.  Patient did not attend group tonight.  Patient taking administered medications.  Patient visible on the unit.

## 2014-03-09 LAB — RAPID URINE DRUG SCREEN, HOSP PERFORMED
AMPHETAMINES: NOT DETECTED
BARBITURATES: NOT DETECTED
Benzodiazepines: POSITIVE — AB
Cocaine: NOT DETECTED
Opiates: NOT DETECTED
Tetrahydrocannabinol: POSITIVE — AB

## 2014-03-09 MED ORDER — FLEET ENEMA 7-19 GM/118ML RE ENEM
1.0000 | ENEMA | Freq: Every day | RECTAL | Status: DC | PRN
  Filled 2014-03-09: qty 1

## 2014-03-09 MED ORDER — METHOCARBAMOL 750 MG PO TABS
750.0000 mg | ORAL_TABLET | Freq: Three times a day (TID) | ORAL | Status: AC | PRN
  Administered 2014-03-09 – 2014-03-11 (×5): 750 mg via ORAL
  Filled 2014-03-09 (×6): qty 1

## 2014-03-09 MED ORDER — GABAPENTIN 400 MG PO CAPS
400.0000 mg | ORAL_CAPSULE | Freq: Three times a day (TID) | ORAL | Status: DC
  Administered 2014-03-09 – 2014-03-12 (×10): 400 mg via ORAL
  Filled 2014-03-09 (×16): qty 1

## 2014-03-09 MED ORDER — GABAPENTIN 400 MG PO CAPS: 400.0000 mg | ORAL_CAPSULE | Freq: Three times a day (TID) | ORAL | Status: DC

## 2014-03-09 MED ORDER — METHADONE HCL 10 MG PO TABS
10.0000 mg | ORAL_TABLET | Freq: Two times a day (BID) | ORAL | Status: DC
  Administered 2014-03-09 – 2014-03-10 (×2): 10 mg via ORAL
  Filled 2014-03-09 (×2): qty 1

## 2014-03-09 MED ORDER — MIRTAZAPINE 30 MG PO TABS
30.0000 mg | ORAL_TABLET | Freq: Every day | ORAL | Status: DC
  Administered 2014-03-09 – 2014-03-11 (×3): 30 mg via ORAL
  Filled 2014-03-09 (×5): qty 1

## 2014-03-09 NOTE — Progress Notes (Signed)
Patient told Clinical research associatewriter that he lied to the MHT about his last BM.  Patient states that he wants an enema.  Writer offered patient MOM but patient refused.

## 2014-03-09 NOTE — Progress Notes (Signed)
[facility] Group Notes:  (Nursing/MHT/Case Management/Adjunct)  Date:  03/09/2014  Time:  2100 Type of Therapy:  wrap up group  Participation Level:  Active  Participation Quality:  Appropriate, Attentive, Sharing and Supportive  Affect:  Appropriate  Cognitive:  Appropriate  Insight:  Improving  Engagement in Group:  Engaged  Modes of Intervention:  Clarification, Education and Support  Summary of Progress/Problems: Pt shared that he is wanting to communicate his feelings rather than hold them in. Pt was very verbal in group and reports wanting to get his life back together. Pt shared that he is starting to feel a little better since his methadone was started back. Pt also shared that his relationship with his wife needed a lot of work and both were wiling to do it for themselves and their two year old son.   Shelah LewandowskySquires, Rozina Pointer Carol 03/09/2014, 11:27 PM

## 2014-03-09 NOTE — Progress Notes (Signed)
[facility] MD Progress Note  03/09/2014 5:05 PM Colton Kelly  MRN:  [id number] Subjective:  Colton Kelly is having a very hard time. He is withdrawing and as he comes off the methadone he is having increasing pain. States he is concerned about his ability to stay "clean" if off the methadone he was to experience worsening of his pain. Afraid of relapsing on other opioids mainly heroin. His fiancee is on Methadone too and she has not come off yet. States that he really wants to do this but he is now not sure if he can come off. He is going to move back to where he was living before and will be able to go back to a methadone clinic where he has a counselor he is very closed to. He has been in touch with this counselor who continues to be supportive of him even when he is not a client of his anymore ( it seems he is going to be able to go back to this clinic) Diagnosis:   DSM5: Trauma-Stressor Disorders:  Posttraumatic Stress Disorder (309.81) Substance/Addictive Disorders:  Opioid Disorder - Severe (304.00), Benzodiazepine dependence severe Depressive Disorders:  Major Depressive Disorder - Severe (296.23) Total Time spent with patient: 30 minutes  Axis I: Substance Induced Mood Disorder  ADL's:  Intact  Sleep: Poor  Appetite:  Poor  Psychiatric Specialty Exam: Physical Exam  Review of Systems  HENT: Negative.   Eyes: Negative.   Respiratory: Negative.   Cardiovascular: Negative.   Gastrointestinal: Negative.   Genitourinary: Negative.   Musculoskeletal: Positive for back pain.       Leg pain  Skin: Negative.   Neurological: Positive for weakness.  Endo/Heme/Allergies: Negative.   Psychiatric/Behavioral: Positive for depression and substance abuse. The patient is nervous/anxious and has insomnia.     Blood pressure 99/80, pulse 109, temperature 97.6 F (36.4 C), temperature source Oral, resp. rate 16, height 6\' 4"  (1.93 m), weight 64.864 kg (143 lb), SpO2 100.00%.Body mass index is 17.41  kg/(m^2).  General Appearance: Fairly Groomed  Patent attorney::  Fair  Speech:  Clear and Coherent, Slow and not spontaneous  Volume:  Decreased  Mood:  Anxious and Depressed  Affect:  anxious,worried in pain  Thought Process:  Coherent and Goal Directed  Orientation:  Full (Time, Place, and Person)  Thought Content:  symptoms worries concerns  Suicidal Thoughts:  No  Homicidal Thoughts:  No  Memory:  Immediate;   Fair Recent;   Fair Remote;   Fair  Judgement:  Fair  Insight:  Present  Psychomotor Activity:  Restlessness  Concentration:  Fair  Recall:  [employer] of Knowledge:NA  Language: Fair  Akathisia:  No  Handed:    AIMS (if indicated):     Assets:  Desire for Improvement  Sleep:  Number of Hours: 4.5   Musculoskeletal: Strength & Muscle Tone: within normal limits Gait & Station: normal Patient leans: N/A  Current Medications: Current Facility-Administered Medications  Medication Dose Route Frequency Provider Last Rate Last Dose  . acetaminophen (TYLENOL) tablet 650 mg  650 mg Oral Q6H PRN Kerry Hough, PA-C   650 mg at 03/09/14 1620  . alum & mag hydroxide-simeth (MAALOX/MYLANTA) 200-200-20 MG/5ML suspension 30 mL  30 mL Oral Q4H PRN Kerry Hough, PA-C      . cloNIDine (CATAPRES) tablet 0.1 mg  0.1 mg Oral BH-qamhs Kerry Hough, PA-C       Followed by  . [START ON 03/12/2014] cloNIDine (CATAPRES) tablet 0.1 mg  0.1 mg Oral QAC breakfast Kerry HoughSpencer E Simon, PA-C      . dicyclomine (BENTYL) tablet 20 mg  20 mg Oral Q6H PRN Kerry HoughSpencer E Simon, PA-C   20 mg at 03/07/14 2144  . diphenhydrAMINE (BENADRYL) capsule 50 mg  50 mg Oral QHS,MR X 1 Spencer E Simon, PA-C   50 mg at 03/09/14 0150  . feeding supplement (ENSURE COMPLETE) (ENSURE COMPLETE) liquid 237 mL  237 mL Oral BID BM Tilda FrancoLindsey Baker, RD   237 mL at 03/09/14 1509  . gabapentin (NEURONTIN) capsule 400 mg  400 mg Oral TID Rachael FeeIrving A Asante Ritacco, MD   400 mg at 03/09/14 1304  . loperamide (IMODIUM) capsule 2-4 mg  2-4 mg  Oral PRN Kerry HoughSpencer E Simon, PA-C   2 mg at 03/07/14 2311  . LORazepam (ATIVAN) tablet 1 mg  1 mg Oral QID Rachael FeeIrving A Destyn Schuyler, MD   1 mg at 03/09/14 1304   Followed by  . [START ON 03/10/2014] LORazepam (ATIVAN) tablet 1 mg  1 mg Oral TID Rachael FeeIrving A Deara Bober, MD       Followed by  . [START ON 03/13/2014] LORazepam (ATIVAN) tablet 1 mg  1 mg Oral BID Rachael FeeIrving A Emmit Oriley, MD       Followed by  . [START ON 03/16/2014] LORazepam (ATIVAN) tablet 0.5 mg  0.5 mg Oral BID Rachael FeeIrving A Ivonne Freeburg, MD       Followed by  . [START ON 03/18/2014] LORazepam (ATIVAN) tablet 0.5 mg  0.5 mg Oral Daily Rachael FeeIrving A Dotsie Gillette, MD      . LORazepam (ATIVAN) tablet 1 mg  1 mg Oral Q6H PRN Rachael FeeIrving A Emira Eubanks, MD   1 mg at 03/09/14 1122  . magnesium hydroxide (MILK OF MAGNESIA) suspension 30 mL  30 mL Oral Daily PRN Kerry HoughSpencer E Simon, PA-C      . methocarbamol (ROBAXIN) tablet 750 mg  750 mg Oral Q8H PRN Rachael FeeIrving A Ritchie Klee, MD      . mirtazapine (REMERON) tablet 30 mg  30 mg Oral QHS Rachael FeeIrving A Merelin Human, MD      . multivitamin with minerals tablet 1 tablet  1 tablet Oral Daily Rachael FeeIrving A Alayzia Pavlock, MD   1 tablet at 03/09/14 0810  . naproxen (NAPROSYN) tablet 500 mg  500 mg Oral BID PRN Kerry HoughSpencer E Simon, PA-C   500 mg at 03/09/14 1307  . nicotine (NICODERM CQ - dosed in mg/24 hours) patch 21 mg  21 mg Transdermal Daily Rachael FeeIrving A Elizardo Chilson, MD   21 mg at 03/09/14 0824  . ondansetron (ZOFRAN-ODT) disintegrating tablet 4 mg  4 mg Oral Q6H PRN Kerry HoughSpencer E Simon, PA-C      . sodium phosphate (FLEET) 7-19 GM/118ML enema 1 enema  1 enema Rectal Daily PRN Rachael FeeIrving A Cashay Manganelli, MD        Lab Results: No results found for this or any previous visit (from the past 48 hour(s)).  Physical Findings: AIMS: Facial and Oral Movements Muscles of Facial Expression: None, normal Lips and Perioral Area: None, normal Jaw: None, normal Tongue: None, normal,Extremity Movements Upper (arms, wrists, hands, fingers): None, normal Lower (legs, knees, ankles, toes): None, normal, Trunk Movements Neck, shoulders,  hips: None, normal, Overall Severity Severity of abnormal movements (highest score from questions above): None, normal Incapacitation due to abnormal movements: None, normal Patient's awareness of abnormal movements (rate only patient's report): No Awareness, Dental Status Current problems with teeth and/or dentures?: No Does patient usually wear dentures?: No  CIWA:  CIWA-Ar Total: 6 COWS:  COWS Total Score: 6  Treatment Plan Summary: Daily contact with patient to assess and evaluate symptoms and progress in treatment Medication management  Plan: Supportive approach/coping skills/relapse prevention           Reassess for resuming Methadone   Medical Decision Making Problem Points:  Review of psycho-social stressors (1) Data Points:  Review of medication regiment & side effects (2) Review of new medications or change in dosage (2)  I certify that inpatient services furnished can reasonably be expected to improve the patient's condition.   Edilia Ghuman A 03/09/2014, 5:05 PM

## 2014-03-09 NOTE — Progress Notes (Signed)
D: Patient is alert and oriented. Pts mood is depressed and affect is flat. Pt complains of body aches all over, blurry vision, and insomnia last night. Pt reports passive SI. Pt denies HI and AVH. Pt BP 101/63 standing. Pt observed sitting in the day room interacting with other pts. Pt attending groups. Pt states his goal for the day is to "get better." A: Emotional support given to pt. Pt encouraged to increase fluids. Pt encouraged to go to groups today. PRN medications administered per providers orders for generalized body aches, headache, and anxiety (See MAR). Medications administered per providers orders (See MAR). 15 minute checks completed per protocol for pt safety.  R: Pt cooperative and receptive to nursing interventions. Pt verbally contracts for safety.

## 2014-03-09 NOTE — [facility] Group Notes (Signed)
The focus of this group is to educate the patient on the purpose and policies of crisis stabilization and provide a format to answer questions about their admission.  The group details unit policies and expectations of patients while admitted.  Patient did not attend 0900 nurse education orientation group this morning.  Patient stayed in bed.   

## 2014-03-09 NOTE — Plan of Care (Signed)
Problem: Ineffective individual coping Goal: STG: Pt will be able to identify effective and ineffective STG: Pt will be able to identify effective and ineffective coping patterns  Outcome: Progressing Pt encouraged to go to groups. Pt receptive of encouragement. Pt spending more time interacting in the day room. Goal: STG: Patient will remain free from self harm Outcome: Progressing 15 minute checks completed per protocol for pt safety. Pt verbally contracts for safety. Goal: STG: Patient will participate in after care plan Patient will attend attend groups and engage in discussion. Outpatient follow up appointment will be scheduled.  Colton Kelly.Quylle Hodnett, LCSW 03/07/2014 3:19 PM  Outcome: Progressing Pt expressing desires to speak with social worker about after care plans.  Problem: Diagnosis: Increased Risk For Suicide Attempt Goal: STG-Patient Will Report Suicidal Feelings to Staff Outcome: Progressing Pt verbally contracts for safety.

## 2014-03-10 MED ORDER — METHADONE HCL 10 MG PO TABS
10.0000 mg | ORAL_TABLET | Freq: Three times a day (TID) | ORAL | Status: DC
  Administered 2014-03-10 – 2014-03-12 (×9): 10 mg via ORAL
  Filled 2014-03-10 (×9): qty 1

## 2014-03-10 NOTE — Progress Notes (Signed)
D) Pt continues to have a lot of physical aches and pains r/t withdrawal. Stares he is slowly starting to feel better because of the Methadone begin increased. Pt comes to the window frequently for PRN's. States that he was able to go and eat this afternoon for the first time in a long time. "I was even hungry today". Pt rates his depression at a 9, his hopelessness at an 8 and his anxiety at a 10.  A) Pt given support, reassurance and praise. Provided with education about detoxing and what medications he has to help him. Encouragement given. R) Pt denies SI and HI.

## 2014-03-10 NOTE — [facility] Group Notes (Signed)
[facility]   03/10/2014 12:07 PM    Participation Quality:  Appropraite  Mood/Affect:  Appropriate  Depression Rating:  8  Anxiety Rating:  10  Thoughts of Suicide:  No  Will you contract for safety?   NA  Current AVH:  No  Plan for Discharge/Comments:  Patient attended discharge planning group and actively participated in group. He advised of anxiety being high due to being off Methadone and feels he will be better once Methadone is in place.  Suiicide prevention education reviewed and SPE document provided.   Transportation Means: Patient has transportation.   Supports:  Patient has a support system.   Colton Kelly, Colton Kelly

## 2014-03-10 NOTE — Progress Notes (Signed)
D. Pt has been up and visible in milieu this evening, able to attend and participate in milieu activities. Pt appears restless and fidgety while in the milieu, pt endorsing difficulties with his withdrawal but did state that he feels slightly better now that he is back on methadone. Pt did receive medications without incident. A. Support and encouragement provided. R. Safety maintained, will continue to monitor.

## 2014-03-10 NOTE — [facility] Group Notes (Signed)
Adult Psychoeducational Group Note  Date:  03/10/2014 Time:  11:28 PM  Group Topic/Focus:  AA Meeting  Participation Level:  Active  Participation Quality:  Appropriate  Affect:  Appropriate  Cognitive:  Appropriate  Insight: Appropriate  Engagement in Group:  Engaged  Modes of Intervention:  Discussion and Education  Additional Comments:  Jonny RuizJohn was very engaged in group.  He explained that he can't afford to "keep messing up".  Caroll RancherLindsay, Brianna Esson A 03/10/2014, 11:28 PM

## 2014-03-10 NOTE — Progress Notes (Signed)
[facility]  03/10/2014 2:14 PM Colton Kelly  MRN:  [id number] Subjective:  Colton Kelly is still endorsing the aches and pains. He was started back on the Methadone. He has arranged for follow up in the previous methadone clinic. He states he is hopeful that a lower dose of methadone than the one he was using (120 mg) will help with the pain as well as the withdrawal, the cravings. States he does not want to relapse. He is committed to do whatever he needs to do not to go back to using Diagnosis:   DSM5: Trauma-Stressor Disorders:  Posttraumatic Stress Disorder (309.81) Substance/Addictive Disorders:  Opioid Disorder - Severe (304.00) Depressive Disorders:  Major Depressive Disorder - Severe (296.23) Total Time spent with patient: 30 minutes  Axis I: Substance Induced Mood Disorder  ADL's:  Intact  Sleep: Poor  Appetite:  improving  Psychiatric Specialty Exam: Physical Exam  Review of Systems  Constitutional: Positive for malaise/fatigue.  HENT: Negative.   Eyes: Negative.   Respiratory: Negative.   Cardiovascular: Negative.   Gastrointestinal: Negative.   Genitourinary: Negative.   Musculoskeletal: Positive for back pain, joint pain and myalgias.  Skin: Negative.   Neurological: Positive for weakness.  Endo/Heme/Allergies: Negative.   Psychiatric/Behavioral: Positive for depression and substance abuse. The patient has insomnia.     Blood pressure 99/66, pulse 90, temperature 98.7 F (37.1 C), temperature source Oral, resp. rate 16, height 6\' 4"  (1.93 m), weight 64.864 kg (143 lb), SpO2 100.00%.Body mass index is 17.41 kg/(m^2).  General Appearance: Fairly Groomed  Patent attorneyye Contact::  Fair  Speech:  Clear and Coherent  Volume:  Decreased  Mood:  Anxious and Depressed  Affect:  Restricted  Thought Process:  Coherent and Goal Directed  Orientation:  Full (Time, Place, and Person)  Thought Content:  symptoms events worries concerns  Suicidal Thoughts:  No  Homicidal  Thoughts:  No  Memory:  Immediate;   Fair Recent;   Fair Remote;   Fair  Judgement:  Fair  Insight:  Present  Psychomotor Activity:  Restlessness  Concentration:  Fair  Recall:  [employer]Fair  Fund of Knowledge:NA  Language: Fair  Akathisia:  No  Handed:    AIMS (if indicated):     Assets:  Desire for Improvement Housing Social Support  Sleep:  Number of Hours: 4   Musculoskeletal: Strength & Muscle Tone: within normal limits Gait & Station: normal Patient leans: N/A  Current Medications: Current Facility-Administered Medications  Medication Dose Route Frequency Provider Last Rate Last Dose  . acetaminophen (TYLENOL) tablet 650 mg  650 mg Oral Q6H PRN Kerry HoughSpencer E Simon, PA-C   650 mg at 03/09/14 1620  . alum & mag hydroxide-simeth (MAALOX/MYLANTA) 200-200-20 MG/5ML suspension 30 mL  30 mL Oral Q4H PRN Kerry HoughSpencer E Simon, PA-C      . cloNIDine (CATAPRES) tablet 0.1 mg  0.1 mg Oral BH-qamhs Spencer E Simon, PA-C   0.1 mg at 03/10/14 0809   Followed by  . [START ON 03/12/2014] cloNIDine (CATAPRES) tablet 0.1 mg  0.1 mg Oral QAC breakfast Kerry HoughSpencer E Simon, PA-C      . dicyclomine (BENTYL) tablet 20 mg  20 mg Oral Q6H PRN Kerry HoughSpencer E Simon, PA-C   20 mg at 03/07/14 2144  . diphenhydrAMINE (BENADRYL) capsule 50 mg  50 mg Oral QHS,MR X 1 Kerry HoughSpencer E Simon, PA-C   50 mg at 03/09/14 2307  . feeding supplement (ENSURE COMPLETE) (ENSURE COMPLETE) liquid 237 mL  237 mL Oral BID BM Mardella LaymanLindsey  Baker, RD   237 mL at 03/10/14 1313  . gabapentin (NEURONTIN) capsule 400 mg  400 mg Oral TID Rachael FeeIrving A Taleeya Blondin, MD   400 mg at 03/10/14 1219  . loperamide (IMODIUM) capsule 2-4 mg  2-4 mg Oral PRN Kerry HoughSpencer E Simon, PA-C   2 mg at 03/07/14 2311  . LORazepam (ATIVAN) tablet 1 mg  1 mg Oral TID Rachael FeeIrving A Drayton Tieu, MD   1 mg at 03/10/14 1115   Followed by  . [START ON 03/13/2014] LORazepam (ATIVAN) tablet 1 mg  1 mg Oral BID Rachael FeeIrving A Perl Folmar, MD       Followed by  . [START ON 03/16/2014] LORazepam (ATIVAN) tablet 0.5 mg  0.5 mg Oral BID  Rachael FeeIrving A Brees Hounshell, MD       Followed by  . [START ON 03/18/2014] LORazepam (ATIVAN) tablet 0.5 mg  0.5 mg Oral Daily Rachael FeeIrving A Jariel Drost, MD      . LORazepam (ATIVAN) tablet 1 mg  1 mg Oral Q6H PRN Rachael FeeIrving A Harshini Trent, MD   1 mg at 03/10/14 1310  . magnesium hydroxide (MILK OF MAGNESIA) suspension 30 mL  30 mL Oral Daily PRN Kerry HoughSpencer E Simon, PA-C      . methadone (DOLOPHINE) tablet 10 mg  10 mg Oral TID PC & HS Rachael FeeIrving A Tria Noguera, MD   10 mg at 03/10/14 1219  . methocarbamol (ROBAXIN) tablet 750 mg  750 mg Oral Q8H PRN Rachael FeeIrving A Aslee Such, MD   750 mg at 03/10/14 1310  . mirtazapine (REMERON) tablet 30 mg  30 mg Oral QHS Rachael FeeIrving A Emani Taussig, MD   30 mg at 03/09/14 2132  . multivitamin with minerals tablet 1 tablet  1 tablet Oral Daily Rachael FeeIrving A Kimberleigh Mehan, MD   1 tablet at 03/10/14 0809  . naproxen (NAPROSYN) tablet 500 mg  500 mg Oral BID PRN Kerry HoughSpencer E Simon, PA-C   500 mg at 03/10/14 1310  . nicotine (NICODERM CQ - dosed in mg/24 hours) patch 21 mg  21 mg Transdermal Daily Rachael FeeIrving A Bay Wayson, MD   21 mg at 03/10/14 0810  . ondansetron (ZOFRAN-ODT) disintegrating tablet 4 mg  4 mg Oral Q6H PRN Kerry HoughSpencer E Simon, PA-C      . sodium phosphate (FLEET) 7-19 GM/118ML enema 1 enema  1 enema Rectal Daily PRN Rachael FeeIrving A Danayah Smyre, MD        Lab Results:  Results for orders placed during the hospital encounter of 03/06/14 (from the past 48 hour(s))  URINE RAPID DRUG SCREEN (HOSP PERFORMED)     Status: Abnormal   Collection Time    03/09/14  1:18 PM      Result Value Ref Range   Opiates NONE DETECTED  NONE DETECTED   Cocaine NONE DETECTED  NONE DETECTED   Benzodiazepines POSITIVE (*) NONE DETECTED   Amphetamines NONE DETECTED  NONE DETECTED   Tetrahydrocannabinol POSITIVE (*) NONE DETECTED   Barbiturates NONE DETECTED  NONE DETECTED   Comment:            DRUG SCREEN FOR MEDICAL PURPOSES     ONLY.  IF CONFIRMATION IS NEEDED     FOR ANY PURPOSE, NOTIFY LAB     WITHIN 5 DAYS.                LOWEST DETECTABLE LIMITS     FOR URINE DRUG SCREEN      Drug Class       Cutoff (ng/mL)     Amphetamine  1000     Barbiturate      200     Benzodiazepine   200     Tricyclics       300     Opiates          300     Cocaine          300     THC              50     Performed at [facility]    Physical Findings: AIMS: Facial and Oral Movements Muscles of Facial Expression: None, normal Lips and Perioral Area: None, normal Jaw: None, normal Tongue: None, normal,Extremity Movements Upper (arms, wrists, hands, fingers): None, normal Lower (legs, knees, ankles, toes): None, normal, Trunk Movements Neck, shoulders, hips: None, normal, Overall Severity Severity of abnormal movements (highest score from questions above): None, normal Incapacitation due to abnormal movements: None, normal Patient's awareness of abnormal movements (rate only patient's report): No Awareness, Dental Status Current problems with teeth and/or dentures?: No Does patient usually wear dentures?: No  CIWA:  CIWA-Ar Total: 5 COWS:  COWS Total Score: 6  Treatment Plan Summary: Daily contact with patient to assess and evaluate symptoms and progress in treatment Medication management  Plan: Supportive approach/coping skills/relapse prevention           Optimize response to methadone (lowest effective dose)           Continue to address the co morbidities)  Medical Decision Making Problem Points:  Review of psycho-social stressors (1) Data Points:  Review of medication regiment & side effects (2) Review of new medications or change in dosage (2)  I certify that inpatient services furnished can reasonably be expected to improve the patient's condition.   Zafirah Vanzee A 03/10/2014, 2:14 PM

## 2014-03-10 NOTE — [facility] Group Notes (Signed)
[facility] LCSW Group Therapy  Feelings Around Relapse 1:15 -2:30        03/10/2014  3:21 PM   Type of Therapy:  Group Therapy  Participation Level:  Appropriate  Participation Quality:  Appropriate  Affect:  Appropriate  Cognitive:  Attentive Appropriate  Insight:  Developing/Improving  Engagement in Therapy: Developing/Improving  Modes of Intervention:  Discussion Exploration Problem-Solving Supportive  Summary of Progress/Problems:  The topic for today was feelings around relapse. Patient processed feelings toward relapse and was able to relate to peers.  He shared relapsing for him would be returning to drug use.  He shared it has been five years since he last used heroin but is on Methadone.  Patient shared he also smokes THC.   Patient identified coping skills that can be used to prevent a relapse including acknowledging THC is a drug that altered his mood/mind.  Patient shared he has to get his life together for his two year old son.  Patient shared he was raised within a father and he does not want that for his son.   Wynn BankerHodnett, Colton Kelly 03/10/2014 3:21 PM

## 2014-03-10 NOTE — Tx Team (Signed)
Interdisciplinary Treatment Plan Update   Date Reviewed:  03/10/2014  Time Reviewed:  9:52 AM  Progress in Treatment:   Attending groups: Yes Participating in groups: Yes Taking medication as prescribed: Yes  Tolerating medication: Yes Family/Significant other contact made:  Yes, collateral contact with mother Patient understands diagnosis: Yes  Discussing patient identified problems/goals with staff: Yes Medical problems stabilized or resolved: Yes Denies suicidal/homicidal ideation: Yes Patient has not harmed self or others: Yes  For review of initial/current patient goals, please see plan of care.  Estimated Length of Stay:  3-5 days  Reasons for Continued Hospitalization:  Anxiety Depression Medication stabilization  New Problems/Goals identified:    Discharge Plan or Barriers:   Home with [facility] [place], [place]  Additional Comments:  Continue medication stabilization.    Patient and CSW reviewed patient's identified goals and treatment plan.  Patient verbalized understanding and agreed to treatment plan.   Attendees:  Patient:  03/10/2014 9:52 AM   Signature:  Sallyanne HaversF. Cobos, MD 03/10/2014 9:52 AM  Signature: Geoffery LyonsIrving Lugo, MD 03/10/2014 9:52 AM  Signature: Robbie LouisVivian Kent , RN 03/10/2014 9:52 AM  Signature, Genelle GatherPatti Dukes,  RN 03/10/2014 9:52 AM  Signature:  Lamount Crankerhris Judge, RN 03/10/2014 9:52 AM  Signature:  Juline PatchQuylle Parul Porcelli, LCSW 03/10/2014 9:52 AM  Signature:  Belenda CruiseKristin Drinkard, LCSW-A 03/10/2014 9:52 AM  Signature:  Leisa LenzValerie Enoch, Care Coordinator [facility] 03/10/2014 9:52 AM  Signature:  Jon Gillselora Sutton, [facility] Transition Team 03/10/2014 9:52 AM  Signature:  03/10/2014  9:52 AM  Signature:    RN URCM 03/10/2014  9:52 AM  Signature:   03/10/2014  9:52 AM    Scribe for Treatment Team:   Juline PatchQuylle Lajoy Vanamburg,  03/10/2014 9:52 AM

## 2014-03-10 NOTE — Progress Notes (Signed)
[facility] Group Notes:  (Nursing/MHT/Case Management/Adjunct)  Date:  03/10/2014  Time:  1:05 PM  Type of Therapy:  Therapeutic Activity  Participation Level:  Active  Participation Quality:  Appropriate  Affect:  Appropriate   Euretha Najarro C 03/10/2014, 1:05 PM 

## 2014-03-10 NOTE — [facility] Group Notes (Signed)
[facility] LCSW Group Therapy  Living A Balanced Life  1:15 - 2: 30          03/10/2014   Late Entry for 03/09/14   Type of Therapy:  Group Therapy  Participation Level:  Appropriate  Participation Quality:  Appropriate  Affect:  Depressed, Tearful  Cognitive:  Attentive Appropriate  Insight: Developing/Improving  Engagement in Therapy:  Developing/Improving  Modes of Intervention:  Discussion Exploration Problem-Solving Supportive   Summary of Progress/Problems: Topic for group was Living a Balanced Life.  Patient was able to show how life has become unbalanced.   Patient shared his life has been out of balance due to years of drug use.  Patient was very tearful as he shared five of his friends have died of overdosing over the past six months.  Patient able to  Identify appropriate coping skills including staying on methadone until he can get into a residential program.   Wynn BankerHodnett, Ludean Duhart Hairston 03/10/2014

## 2014-03-11 NOTE — [facility] Group Notes (Signed)
[facility] Group Notes:  (Clinical Social Work)  03/11/2014     10-11AM  Summary of Progress/Problems:   The main focus of today's process group was to learn how to use a decisional balance exercise to move forward in the Stages of Change, which were described and discussed.  Motivational Interviewing and a worksheet were utilized to help patients explore in depth the perceived benefits and costs of a self-sabotaging behavior, as well as the  benefits and costs of replacing that with a healthy coping mechanism.   The patient expressed that he is in the hospital due to suicidal ideation, depression, bipolar disorder.  He stated he self-sabotages by keeping in his emotions, instead of letting them out, and he eventually explodes.  He initially stated he is in Action stage of change, but was able to identify after some discussion that he actually is in Preparation Stage of Change, that he needs to take significant time to prepare more plans for his recovery.  Type of Therapy:  Group Therapy - Process   Participation Level:  Active  Participation Quality:  Attentive, Sharing and Supportive  Affect:  Appropriate and Blunted  Cognitive:  Alert and Appropriate  Insight:  Engaged  Engagement in Therapy:  Engaged  Modes of Intervention:  Education, Motivational Interviewing  Ambrose MantleMareida Grossman-Orr, LCSW 03/11/2014, 1:48 PM

## 2014-03-11 NOTE — Progress Notes (Signed)
Patient did attend the evening speaker AA meeting.  

## 2014-03-11 NOTE — [facility] Group Notes (Signed)
[facility] Group Notes:  (Nursing/MHT/Case Management/Adjunct)  Date:  03/11/2014  Time:  1:54 PM  Type of Therapy:  Psychoeducational Skills-Pt self inventory group  Participation Level:  Active  Participation Quality:  Appropriate  Affect:  Appropriate  Cognitive:  Appropriate  Insight:  Appropriate  Engagement in Group:  Engaged  Modes of Intervention:  Discussion  Summary of Progress/Problems: Pt did attend self inventory group.   Parilee Hally Shanta 03/11/2014, 1:54 PM 

## 2014-03-11 NOTE — Progress Notes (Signed)
D) Pt has been attending the groups and interacting with his peers. Jokes around a lot and tends to not take things too seriously. Was sitting in the dayroom this afternoon and was crying while on the phone. States, "I was talking about my son who is saying he misses me very much and it is just hard for me". Seems to be doing much better since the increase of the methadone yesterday. Not asking for as many prn's today A) Given support, reassurance and praise along with encouragement. Provided with a 1:1. Therapeutic humor used with Pt. R) Pt denies SI and HI. Rates his depression at a 7, his hopelessness at a 6 and his anxiety at a 10. Denies SI and HI.

## 2014-03-11 NOTE — Progress Notes (Signed)
D. Pt has been up and has been active while in the milieu, attending and participating in various activities. Pt has spoken about having generalized achiness but reports it is better now that he is slowly getting back on methadone. Pt has endorsed depression and anxiety today and reports that he will be here through the weekend and spoke about his discharge plans that are in place for next week. A. Support and encouragement provided, medication education provided. R. Pt verbalized understanding, safety maintained, will continue to monitor.

## 2014-03-11 NOTE — Progress Notes (Signed)
Patient ID: Colton Kelly, male   DOB: [birth year], 28 y.o.   MRN: [id number] [facility] MD Progress Note  03/11/2014 2:34 PM Colton Kelly  MRN:  [id number] Subjective:  Patient was seen participating in group.  Patient states he is feeling much better since his admission.  Patient reports that starting back on Methadone at a lower dose is helping.  He has agreed to try Cymbalta for pain and depression because he strongly believes that he is depressed.  Written information was handed to patient for the use and actions of Cymbalta.  Patient will talk to this Clinical research associate tomorrow about starting Cymbalta.  He reports better sleep and appetite since coming in here.  Patient states he will do his best to stay on treatment after his discharge from here.  He denies SI/HI/AVH.  We will continue to monitor patient, provide safety and offer his medications. 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  HENT: Negative.   Eyes: Negative.   Respiratory: Negative.   Cardiovascular: Negative.   Gastrointestinal: Negative.   Genitourinary: Negative.   Musculoskeletal: Positive for back pain, joint pain and myalgias.  Skin: Negative.   Neurological: Positive for weakness.  Endo/Heme/Allergies: Negative.   Psychiatric/Behavioral: Positive for depression and substance abuse. The patient has insomnia.     Blood pressure 151/72, pulse 64, temperature 97.7 F (36.5 C), temperature source Oral, resp. rate 16, height 6\' 4"  (1.93 m), weight 64.864 kg (143 lb), SpO2 100.00%.Body mass index is 17.41  kg/(m^2).  General Appearance: Fairly Groomed  Patent attorney::  Fair  Speech:  Clear and Coherent  Volume:  Decreased  Mood:  Anxious and Depressed  Affect:  Restricted  Thought Process:  Coherent and Goal Directed  Orientation:  Full (Time, Place, and Person)  Thought Content:  symptoms events worries concerns  Suicidal Thoughts:  No  Homicidal Thoughts:  No  Memory:  Immediate;   Fair Recent;   Fair Remote;   Fair  Judgement:  Fair  Insight:  Present  Psychomotor Activity:  Normal, calm  Concentration:  Fair  Recall:  [employer] of Knowledge:NA  Language: Fair  Akathisia:  No  Handed:    AIMS (if indicated):     Assets:  Desire for Improvement Housing Social Support  Sleep:  Number of Hours: 6   Musculoskeletal: Strength & Muscle Tone: within normal limits Gait & Station: normal Patient leans: N/A  Current Medications: Current Facility-Administered Medications  Medication Dose Route Frequency Provider Last Rate Last Dose  . acetaminophen (TYLENOL) tablet 650 mg  650 mg Oral Q6H PRN Kerry Hough, PA-C   650 mg at 03/10/14 1519  . alum & mag hydroxide-simeth (MAALOX/MYLANTA) 200-200-20 MG/5ML suspension 30 mL  30 mL Oral Q4H PRN Kerry Hough, PA-C      . cloNIDine (CATAPRES) tablet 0.1 mg  0.1 mg Oral BH-qamhs Spencer E Simon, PA-C   0.1 mg at 03/10/14 0809   Followed by  . [START ON 03/12/2014] cloNIDine (CATAPRES) tablet 0.1 mg  0.1 mg Oral QAC breakfast Kerry Hough, PA-C      . dicyclomine (BENTYL) tablet 20 mg  20 mg Oral  Q6H PRN Kerry HoughSpencer E Simon, PA-C   20 mg at 03/07/14 2144  . diphenhydrAMINE (BENADRYL) capsule 50 mg  50 mg Oral QHS,MR X 1 Kerry HoughSpencer E Simon, PA-C   50 mg at 03/10/14 2259  . feeding supplement (ENSURE COMPLETE) (ENSURE COMPLETE) liquid 237 mL  237 mL Oral BID BM Tilda FrancoLindsey Baker, RD   237 mL at 03/11/14 1000  . gabapentin (NEURONTIN) capsule 400 mg  400 mg Oral TID Rachael FeeIrving A Lugo, MD   400 mg at 03/11/14 1137  . loperamide (IMODIUM) capsule 2-4  mg  2-4 mg Oral PRN Kerry HoughSpencer E Simon, PA-C   2 mg at 03/07/14 2311  . LORazepam (ATIVAN) tablet 1 mg  1 mg Oral TID Rachael FeeIrving A Lugo, MD   1 mg at 03/11/14 1124   Followed by  . [START ON 03/13/2014] LORazepam (ATIVAN) tablet 1 mg  1 mg Oral BID Rachael FeeIrving A Lugo, MD       Followed by  . [START ON 03/16/2014] LORazepam (ATIVAN) tablet 0.5 mg  0.5 mg Oral BID Rachael FeeIrving A Lugo, MD       Followed by  . [START ON 03/18/2014] LORazepam (ATIVAN) tablet 0.5 mg  0.5 mg Oral Daily Rachael FeeIrving A Lugo, MD      . LORazepam (ATIVAN) tablet 1 mg  1 mg Oral Q6H PRN Rachael FeeIrving A Lugo, MD   1 mg at 03/11/14 0605  . magnesium hydroxide (MILK OF MAGNESIA) suspension 30 mL  30 mL Oral Daily PRN Kerry HoughSpencer E Simon, PA-C      . methadone (DOLOPHINE) tablet 10 mg  10 mg Oral TID PC & HS Rachael FeeIrving A Lugo, MD   10 mg at 03/11/14 [id number]  . methocarbamol (ROBAXIN) tablet 750 mg  750 mg Oral Q8H PRN Rachael FeeIrving A Lugo, MD   750 mg at 03/11/14 1124  . mirtazapine (REMERON) tablet 30 mg  30 mg Oral QHS Rachael FeeIrving A Lugo, MD   30 mg at 03/10/14 2123  . multivitamin with minerals tablet 1 tablet  1 tablet Oral Daily Rachael FeeIrving A Lugo, MD   1 tablet at 03/11/14 [id number]  . naproxen (NAPROSYN) tablet 500 mg  500 mg Oral BID PRN Kerry HoughSpencer E Simon, PA-C   500 mg at 03/10/14 1310  . nicotine (NICODERM CQ - dosed in mg/24 hours) patch 21 mg  21 mg Transdermal Daily Rachael FeeIrving A Lugo, MD   21 mg at 03/11/14 0836  . ondansetron (ZOFRAN-ODT) disintegrating tablet 4 mg  4 mg Oral Q6H PRN Kerry HoughSpencer E Simon, PA-C      . sodium phosphate (FLEET) 7-19 GM/118ML enema 1 enema  1 enema Rectal Daily PRN Rachael FeeIrving A Lugo, MD        Lab Results:  No results found for this or any previous visit (from the past 48 hour(s)).  Physical Findings: AIMS: Facial and Oral Movements Muscles of Facial Expression: None, normal Lips and Perioral Area: None, normal Jaw: None, normal Tongue: None, normal,Extremity Movements Upper (arms, wrists, hands, fingers): None, normal Lower (legs, knees, ankles, toes):  None, normal, Trunk Movements Neck, shoulders, hips: None, normal, Overall Severity Severity of abnormal movements (highest score from questions above): None, normal Incapacitation due to abnormal movements: None, normal Patient's awareness of abnormal movements (rate only patient's report): No Awareness, Dental Status Current problems with teeth and/or dentures?: No Does patient usually wear dentures?: No  CIWA:  CIWA-Ar Total: 1 COWS:  COWS Total Score: 6  Treatment Plan Summary: Daily contact with patient to assess and evaluate symptoms  and progress in treatment Medication management  Plan: Continue with plan of care Continue crisis management Encourage to participate in group and individual sessions Continue medication management/ and review as needed We are using our clonidine protocol for his Opioid detox as needed We are also using 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 Discharge  Plan in progress Address health issues /V/S as needed     Medical Decision Making Problem Points:  Review of psycho-social stressors (1) Data Points:  Review of medication regiment & side effects (2) Review of new medications or change in dosage (2)  I certify that inpatient services furnished can reasonably be expected to improve the patient's condition.   Dahlia ByesONUOHA, JOSEPHINE, C     PMHNP-BC 03/11/2014, 2:34 PM I agreed with findings and treatment plan of this patient

## 2014-03-12 MED ORDER — MIRTAZAPINE 30 MG PO TABS: 30.0000 mg | ORAL_TABLET | Freq: Every day | ORAL | Status: DC

## 2014-03-12 MED ORDER — GABAPENTIN 400 MG PO CAPS: 400.0000 mg | ORAL_CAPSULE | Freq: Three times a day (TID) | ORAL | Status: DC

## 2014-03-12 NOTE — Progress Notes (Signed)
D. Pt has been up and has been active in milieu this evening, has attended and participated in evening group activity. Pt spoke about feeling a little better today, spoke about how he is hopeful to be discharged on Monday but does still report anxiety and has requested PRN medications to help with his anxiety. Pt spoke about wanting to be clean so he can be a better father to his son. A. Support and encouragement provided. R. Safety maintained, will continue to monitor.

## 2014-03-12 NOTE — Progress Notes (Signed)
Patient denies SI/HI, denies A/V hallucinations. Patient verbalizes understanding of discharge instructions, follow up care and prescriptions. Patient [facility] including hat, wallet, methadone clinic card, nicorette card, cigarettes, cigarette lighter, cell phone, keys, marble. Patient escorted out by staff, transported by family.

## 2014-03-12 NOTE — Progress Notes (Signed)
[facility] :  Will you be returning to the same living situation after discharge: Yes,  home with wife At discharge, do you have transportation home?:Yes,  with family Do you have the ability to pay for your medications:Yes,  discussed with weekday CSW  Release of information consent forms completed and in the chart;  Patient's signature needed at discharge.  Patient to Follow up at: Follow-up Information   [facility] On 03/14/2014. (Please go to the clinic between 5AM - 8AM for new patient assessment on Tuesday, March 14, 2014)    Contact information:   [address] [place], [place]   [id number]  [phone]      [facility] On 03/15/2014. (Please go to [facility]'s walk in clinic on Wednesday, March 15, 2014 or any Monday - Thursday between 8AM-3PM or on Friday from 8AM-1PM)    Contact information:   [address] [place], [place]   [id number]  [phone]      Patient denies SI/HI:   Yes,  denies    Safety Planning and Suicide Prevention discussed:  Yes,  per notes  Sarina SerGrossman-Orr, Jase Himmelberger Jo 03/12/2014, 2:59 PM

## 2014-03-12 NOTE — Progress Notes (Signed)
Notified by [facility] (MW ) on 300 hall this morning that " your patient is smoking again in his room". This nurse walked into patient's room and upon entering his room, around 0800,  there is a visible screen of  cigarette smoke hanging in the air. This nurse asked pt  if he had any matches and he replied     "   No. I flushed them" He makes additional comments about not liking [facility], he feels like we show favoritism to other  Patients by being nicer to those patients . When this nurse pointed out to him that he had been observed coloring penises on the floor of the 300 hall medication room , he stated " well, what do you expect..I  am sooooo bored here".  This nurse spoke with pt about need for him to show he is engaged in his own recovery...that he needs  to be at the center of his treatment team , involved in the decisions affecting his life and that he needs to be compliant while in this facility and coloring vulgarity and smoking cigarettes repeatedly in his room ( in a no smoking facility!!!!) are not behaviors indicative of compliance and engaged in recovery.RN encouraged pt to address his issues..To allow staff to process with him and to help him deal with his drug issues.
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
## 2014-03-12 NOTE — Progress Notes (Signed)
D) Pt. Observed to be verbally loud with peers, singing "snitches get stiches" during AM med pass at med window. Pt. was caught by charge RN smoking in his room. Charge RN was called by [facility] staff who smelled smoke coming from pt's room. Per charge RN there was a "wall of smoke" upon entering pt's room. Pt. did tell RN that he flushed the cigarette in the toilet when asked. Mr. Colton Kelly has been med compliant thus far this shift, however, he refused his scheduled AM dose of Clonidine when offered. Presents with a pleasant /congruent affect this AM when approached by Clinical research associatewriter.  A) All meds given as ordered. Support and encouragement offered towards treatment goals. Safety needs of self and others in relation to facility rules and treatment contract (No smoking on campus) was reviewed with pt. by Clinical research associatewriter. Q 15 minutes checks continues for detox / withdrawal symptoms and pt. is progressing well without injuries. R) Pt. Took his meds this AM. Attended groups as scheduled. Verbalized understanding during safety discussion and facility rules. Remains safe while hospitalized.
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
## 2014-03-12 NOTE — B[facility] Group Notes (Signed)
B[facility] Group Notes:  (Clinical Social Work)  03/12/2014  10:00-11:00AM  Summary of Progress/Problems:   The main focus of today's process group was to   1)  discuss the importance of adding supports  2)  define health supports versus unhealthy supports  3)  identify the patient's current unhealthy supports and plan how to handle them  4)  Identify the patient's current healthy supports and plan what to add.  An emphasis was placed on using counselor, doctor, therapy groups, 12-step groups, and problem-specific support groups to expand supports.    The patient expressed full comprehension of the concepts presented, and agreed that there is a need to add more supports.  The patient stated that his wife and 2yo son are his healthy supports, while his main unhealthy support in his life is himself.  He interacted frequently and well within group until he was called out to be seen by doctor and Nurse Practitioner, was out for awhile.  He abruptly returned to the room with slamming the door open which made a large bang and startled CSW and group members, as well as staff outside the day room.  A number of staff ran to aid, and he stated to CSW he was not angry, was calm while saying this.  He did get called back out to talk about the incident with the Administrative Coordinator.  A good deal of time and effort was needed to reestablish order in the group, as well as an overall feeling of safety in the group.  Type of Therapy:  Process Group with Motivational Interviewing  Participation Level:  Active  Participation Quality:  Appropriate, Attentive, Intrusive, Sharing and Supportive  Affect:  Angry and Blunted  Cognitive:  Oriented  Insight:  Limited  Engagement in Therapy:  Engaged  Modes of Intervention:   Education, Support and Processing, Activity  Pilgrim's PrideMareida Grossman-Orr, LCSW 03/12/2014, 12:15pm

## 2014-03-15 NOTE — Progress Notes (Addendum)
Patient Discharge Instructions:  After Visit Summary (AVS):   Faxed to:  03/15/14 Discharge Summary Note:   Faxed to:  03/15/14 Psychiatric Admission Assessment Note:   Faxed to:  03/15/14 Suicide Risk Assessment - Discharge Assessment:   Faxed to:  03/15/14 Faxed/Sent to the Next Level Care provider:  03/15/14 Faxed to [facility] @ [phone] Faxed to [facility] @ [phone]  Jerelene ReddenSheena E [place], 03/15/2014, 3:48 PM

## 2014-12-19 ENCOUNTER — Emergency Department (HOSPITAL_COMMUNITY)
Admission: EM | Admit: 2014-12-19 | Discharge: 2014-12-19 | Disposition: A | Discharge status: Other Acute Inpatient Hospital | Payer: Self-pay | Attending: Emergency Medicine | Admitting: Emergency Medicine

## 2014-12-19 ENCOUNTER — Encounter (HOSPITAL_COMMUNITY): Payer: Self-pay

## 2014-12-19 ENCOUNTER — Encounter (HOSPITAL_COMMUNITY): Payer: Self-pay | Admitting: General Practice

## 2014-12-19 ENCOUNTER — Observation Stay (HOSPITAL_COMMUNITY)
Admission: AD | Admit: 2014-12-19 | Discharge: 2014-12-20 | Disposition: A | Discharge status: Home / Self Care | Payer: Federal, State, Local not specified - Other | Source: Intra-hospital | Attending: Psychiatry | Admitting: Psychiatry

## 2014-12-19 DIAGNOSIS — F1120 Opioid dependence, uncomplicated: Secondary | ICD-10-CM | POA: Diagnosis present

## 2014-12-19 DIAGNOSIS — F1994 Other psychoactive substance use, unspecified with psychoactive substance-induced mood disorder: Secondary | ICD-10-CM | POA: Diagnosis not present

## 2014-12-19 DIAGNOSIS — Z720 Tobacco use: Secondary | ICD-10-CM | POA: Insufficient documentation

## 2014-12-19 DIAGNOSIS — R251 Tremor, unspecified: Secondary | ICD-10-CM | POA: Insufficient documentation

## 2014-12-19 DIAGNOSIS — F419 Anxiety disorder, unspecified: Secondary | ICD-10-CM | POA: Insufficient documentation

## 2014-12-19 DIAGNOSIS — Z888 Allergy status to other drugs, medicaments and biological substances status: Secondary | ICD-10-CM | POA: Insufficient documentation

## 2014-12-19 DIAGNOSIS — F17210 Nicotine dependence, cigarettes, uncomplicated: Secondary | ICD-10-CM | POA: Insufficient documentation

## 2014-12-19 DIAGNOSIS — F4310 Post-traumatic stress disorder, unspecified: Secondary | ICD-10-CM | POA: Diagnosis not present

## 2014-12-19 DIAGNOSIS — Z79899 Other long term (current) drug therapy: Secondary | ICD-10-CM | POA: Insufficient documentation

## 2014-12-19 DIAGNOSIS — F329 Major depressive disorder, single episode, unspecified: Secondary | ICD-10-CM | POA: Insufficient documentation

## 2014-12-19 DIAGNOSIS — G8929 Other chronic pain: Secondary | ICD-10-CM | POA: Insufficient documentation

## 2014-12-19 DIAGNOSIS — F1114 Opioid abuse with opioid-induced mood disorder: Principal | ICD-10-CM | POA: Insufficient documentation

## 2014-12-19 DIAGNOSIS — R000 Tachycardia, unspecified: Secondary | ICD-10-CM | POA: Insufficient documentation

## 2014-12-19 HISTORY — DX: Anxiety disorder, unspecified: F41.9

## 2014-12-19 HISTORY — DX: Depression, unspecified: F32.A

## 2014-12-19 HISTORY — DX: Acute myocardial infarction, unspecified: I21.9

## 2014-12-19 HISTORY — DX: Major depressive disorder, single episode, unspecified: F32.9

## 2014-12-19 LAB — COMPREHENSIVE METABOLIC PANEL
ALT: 15 U/L — ABNORMAL LOW (ref 17–63)
ANION GAP: 7 (ref 5–15)
AST: 17 U/L (ref 15–41)
Albumin: 4 g/dL (ref 3.5–5.0)
Alkaline Phosphatase: 62 U/L (ref 38–126)
BUN: 9 mg/dL (ref 6–20)
CALCIUM: 8.9 mg/dL (ref 8.9–10.3)
CHLORIDE: 108 mmol/L (ref 101–111)
CO2: 26 mmol/L (ref 22–32)
CREATININE: 0.7 mg/dL (ref 0.61–1.24)
GFR calc Af Amer: >60 mL/min (ref >60)
Glucose, Bld: 93 mg/dL (ref 65–99)
POTASSIUM: 3.5 mmol/L (ref 3.5–5.1)
Sodium: 141 mmol/L (ref 135–145)
TOTAL PROTEIN: 6.3 g/dL — AB (ref 6.5–8.1)
Total Bilirubin: 0.3 mg/dL (ref 0.3–1.2)

## 2014-12-19 LAB — URINALYSIS, ROUTINE W REFLEX MICROSCOPIC
BILIRUBIN URINE: NEGATIVE
Glucose, UA: NEGATIVE mg/dL
Hgb urine dipstick: NEGATIVE
KETONES UR: NEGATIVE mg/dL
Leukocytes, UA: NEGATIVE
NITRITE: NEGATIVE
Protein, ur: NEGATIVE mg/dL
Specific Gravity, Urine: 1.007 (ref 1.005–1.030)
Urobilinogen, UA: 0.2 mg/dL (ref 0.0–1.0)
pH: 6.5 (ref 5.0–8.0)

## 2014-12-19 LAB — RAPID URINE DRUG SCREEN, HOSP PERFORMED
Amphetamines: NOT DETECTED
BARBITURATES: NOT DETECTED
BENZODIAZEPINES: NOT DETECTED
COCAINE: NOT DETECTED
OPIATES: NOT DETECTED
Tetrahydrocannabinol: NOT DETECTED

## 2014-12-19 LAB — CBC
HCT: 41.1 % (ref 39.0–52.0)
HEMOGLOBIN: 14.1 g/dL (ref 13.0–17.0)
MCH: 30.5 pg (ref 26.0–34.0)
MCHC: 34.3 g/dL (ref 30.0–36.0)
MCV: 89 fL (ref 78.0–100.0)
Platelets: 301 10*3/uL (ref 150–400)
RBC: 4.62 MIL/uL (ref 4.22–5.81)
RDW: 13.3 % (ref 11.5–15.5)
WBC: 11.4 10*3/uL — ABNORMAL HIGH (ref 4.0–10.5)

## 2014-12-19 LAB — CBG MONITORING, ED: Glucose-Capillary: 89 mg/dL (ref 65–99)

## 2014-12-19 MED ORDER — ALUM & MAG HYDROXIDE-SIMETH 200-200-20 MG/5ML PO SUSP: 30.0000 mL | ORAL | Status: DC | PRN

## 2014-12-19 MED ORDER — LORAZEPAM 1 MG PO TABS
2.0000 mg | ORAL_TABLET | Freq: Four times a day (QID) | ORAL | Status: DC | PRN
  Administered 2014-12-19 – 2014-12-20 (×2): 2 mg via ORAL
  Filled 2014-12-19 (×2): qty 2

## 2014-12-19 MED ORDER — MIRTAZAPINE 15 MG PO TABS: 30.0000 mg | ORAL_TABLET | Freq: Every day | ORAL | Status: DC

## 2014-12-19 MED ORDER — GABAPENTIN 400 MG PO CAPS: 400.0000 mg | ORAL_CAPSULE | Freq: Three times a day (TID) | ORAL | Status: DC

## 2014-12-19 MED ORDER — ZOLPIDEM TARTRATE 5 MG PO TABS: 5.0000 mg | ORAL_TABLET | Freq: Every evening | ORAL | Status: DC | PRN

## 2014-12-19 MED ORDER — IBUPROFEN 200 MG PO TABS: 600.0000 mg | ORAL_TABLET | Freq: Three times a day (TID) | ORAL | Status: DC | PRN

## 2014-12-19 MED ORDER — CARBAMAZEPINE 200 MG PO TABS
200.0000 mg | ORAL_TABLET | Freq: Three times a day (TID) | ORAL | Status: DC
  Administered 2014-12-19 – 2014-12-20 (×2): 200 mg via ORAL
  Filled 2014-12-19 (×2): qty 1

## 2014-12-19 MED ORDER — DIPHENHYDRAMINE HCL 50 MG PO CAPS
50.0000 mg | ORAL_CAPSULE | Freq: Every evening | ORAL | Status: DC | PRN
  Administered 2014-12-19: 50 mg via ORAL
  Filled 2014-12-19: qty 1

## 2014-12-19 MED ORDER — LORAZEPAM 2 MG/ML IJ SOLN
1.0000 mg | INTRAMUSCULAR | Status: DC | PRN
  Administered 2014-12-19: 1 mg via INTRAVENOUS
  Filled 2014-12-19: qty 1

## 2014-12-19 MED ORDER — ACETAMINOPHEN 325 MG PO TABS: 650.0000 mg | ORAL_TABLET | Freq: Four times a day (QID) | ORAL | Status: DC | PRN

## 2014-12-19 MED ORDER — ONDANSETRON HCL 4 MG PO TABS: 4.0000 mg | ORAL_TABLET | Freq: Three times a day (TID) | ORAL | Status: DC | PRN

## 2014-12-19 MED ORDER — NICOTINE 21 MG/24HR TD PT24
21.0000 mg | MEDICATED_PATCH | Freq: Every day | TRANSDERMAL | Status: DC
Start: 2014-12-19 — End: 2014-12-19

## 2014-12-19 MED ORDER — DIPHENHYDRAMINE HCL 50 MG PO CAPS: 50.0000 mg | ORAL_CAPSULE | Freq: Every evening | ORAL | Status: DC | PRN

## 2014-12-19 MED ORDER — LORAZEPAM 1 MG PO TABS
1.0000 mg | ORAL_TABLET | Freq: Three times a day (TID) | ORAL | Status: DC | PRN
  Administered 2014-12-19: 1 mg via ORAL
  Filled 2014-12-19: qty 1

## 2014-12-19 MED ORDER — MAGNESIUM HYDROXIDE 400 MG/5ML PO SUSP: 30.0000 mL | Freq: Every day | ORAL | Status: DC | PRN

## 2014-12-19 NOTE — ED Notes (Signed)
Per Baxter Hire, [facility], pt accepted to [facility] - may be transported at 2030.

## 2014-12-19 NOTE — ED Notes (Signed)
[place], MD at bedside 
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
## 2014-12-19 NOTE — ED Notes (Signed)
Pt changed into paper scrubs - belongings placed in 1 labeled bag.
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
## 2014-12-19 NOTE — BH Assessment (Signed)
[facility] Assessment Progress Note    Called and scheduled this pt's tele assessment appointment with this clinician.  Attempted to call EDP Radford Pax, but he was in with another pt.  Will call after assessment.  Casimer Lanius, MS, [facility] Therapeutic Triage Specialist [facility]

## 2014-12-19 NOTE — ED Notes (Addendum)
Pt on phone at nurses' desk talking w/spouse. Pt voiced understanding of [facility] policies.

## 2014-12-19 NOTE — ED Notes (Signed)
[facility] called to transport pt 

## 2014-12-19 NOTE — ED Notes (Signed)
Per [place], [facility], pt is not SI/HI and is possibly being accepted to [facility] after 1900 this evening. Pt given blue scrubs to change into - aware of tx plan.
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
## 2014-12-20 LAB — TSH: TSH: 1.192 u[IU]/mL (ref 0.350–4.500)

## 2014-12-20 LAB — CARBAMAZEPINE LEVEL, TOTAL: CARBAMAZEPINE LVL: 9.8 ug/mL (ref 4.0–12.0)

## 2014-12-20 MED ORDER — METHADONE HCL 10 MG PO TABS
100.0000 mg | ORAL_TABLET | Freq: Every day | ORAL | Status: DC
  Administered 2014-12-20: 100 mg via ORAL
  Filled 2014-12-20: qty 10

## 2014-12-20 MED ORDER — CARBAMAZEPINE 200 MG PO TABS: 200.0000 mg | ORAL_TABLET | Freq: Three times a day (TID) | ORAL | Status: AC

## 2014-12-20 MED ORDER — NICOTINE 21 MG/24HR TD PT24: 21.0000 mg | MEDICATED_PATCH | Freq: Every day | TRANSDERMAL | Status: AC

## 2014-12-20 MED ORDER — METHADONE HCL 10 MG/ML PO CONC: 100.0000 mg | Freq: Every day | ORAL | Status: AC

## 2014-12-20 MED ORDER — NICOTINE 21 MG/24HR TD PT24
21.0000 mg | MEDICATED_PATCH | Freq: Every day | TRANSDERMAL | Status: DC
  Administered 2014-12-20: 21 mg via TRANSDERMAL
  Filled 2014-12-20: qty 1

## 2014-12-20 NOTE — Progress Notes (Signed)
D/C instructions/meds/follow-up appointments reviewed, pt verbalized understanding, pt's belongings returned to pt, samples given. 

## 2014-12-20 NOTE — [facility] Counselor (Signed)
This counselor spoke with pt in regards to his admission here. Pt shared that he recently signed out AMA from a [facility] after reportedly 5 days of Psychosis after inhaling a blunt laced with Meth,cocaine and bath salts. Pt states he has a history of opoid and heroin addiction and states that he wants help. Pt shared that he would like to receive inpatient treatment and this was also recommenced by the P.A. On call. Pt shared that he has crying spells and often feels worthless. Pt shared that he is not suicidal, but continues to hear whispers and see shadowy figures. Pt would greatly benefit from receiving inpatient treatment services.  Ardelle Park, MA OBS Counselor

## 2014-12-20 NOTE — Plan of Care (Signed)
[facility] Observation Crisis Plan  Reason for Crisis Plan:  Crisis Stabilization   Plan of Care:  Referral for Inpatient Hospitalization  Family Support:    spouse  Current Living Environment:  Living Arrangements: Spouse/significant other, Children  Insurance:   Hospital Account    Name Acct ID Class Status Primary Coverage   Colton Kelly, Colton Kelly [id number] BEHAVIORAL HEALTH OBSERVATION [facility] FOR [facility]/DD/SAS - [place] [address]        Guarantor Account (for Hospital Account [id number])    Name Relation to Pt Service Area Active? Acct Type   Colton Kelly Self [facility] Yes Behavioral Health   Address Phone       [address] [place], [place] [address] [phone](H)          Coverage Information (for Hospital Account [id number])    F/O Payor/Plan Precert #   [facility] FOR MH/DD/SAS/[place] [address]    Subscriber Subscriber #   Colton Kelly    Address Phone   [address] [place], [place] [address] [phone]      Legal Guardian:   none  Primary Care Provider:  No PCP Per Patient  Current Outpatient Providers:  [facility] treatment center  Psychiatrist:     Counselor/Therapist:     Compliant with Medications:  Yes  Additional Information:   [name] 8/3/20165:17 AM

## 2014-12-20 NOTE — Discharge Instructions (Signed)
For your continued mental health maintenance, it is recommended that you follow up as soon as possible with any of the below providers:  [facility] [address] [place], [place] [address] [phone] Walk-in hours are Monday - Friday from 8:00 am - 3:00 pm.    [facility]) [address] [place], [place] [address]  [phone] Walk in hours are Monday - Friday 8:30am - 12:00pm and 1:00pm - 230pm
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
## 2014-12-20 NOTE — Progress Notes (Signed)
[facility] INPATIENT:  Family/Significant Other Suicide Prevention Education  Suicide Prevention Education:  Patient Refusal for Family/Significant Other Suicide Prevention Education: The patient Colton Kelly has refused to provide written consent for family/significant other to be provided Family/Significant Other Suicide Prevention Education during admission and/or prior to discharge.  Physician notified. Patient not currently suicidal but staff is meeting with wife in the am.  [name] 12/20/2014, 12:01 AM

## 2014-12-20 NOTE — Progress Notes (Signed)
Pt. was seen this date by this writer who assisted with discharge planning while patient was in the observational unit. Pt. had presented earlier with symptoms associated with substance abuse where patient stated that he had used what he thought was Cannabis but contained what he believed to be other additives. Pt. was at the time of this assessment was stable and denied any S/I or H/I and stated he was ready to return to his residence. This Probation officer contacted the patients wife who met with patient and this Probation officer to discuss patient follow up. It was suggested by this Probation officer that the individual should address his SA issues and gave him contact information for [facility]. This Probation officer contacted [employer] and gave the patient the times which he could be seen and what services they provided.

## 2016-07-12 IMAGING — CR DG CHEST 2V
2 series · 2 of 2 positions shown · non-contrast
Comparison: None.

CLINICAL DATA: Suicidal ideation.

EXAM:
CHEST  2 VIEW

[w chest pa]
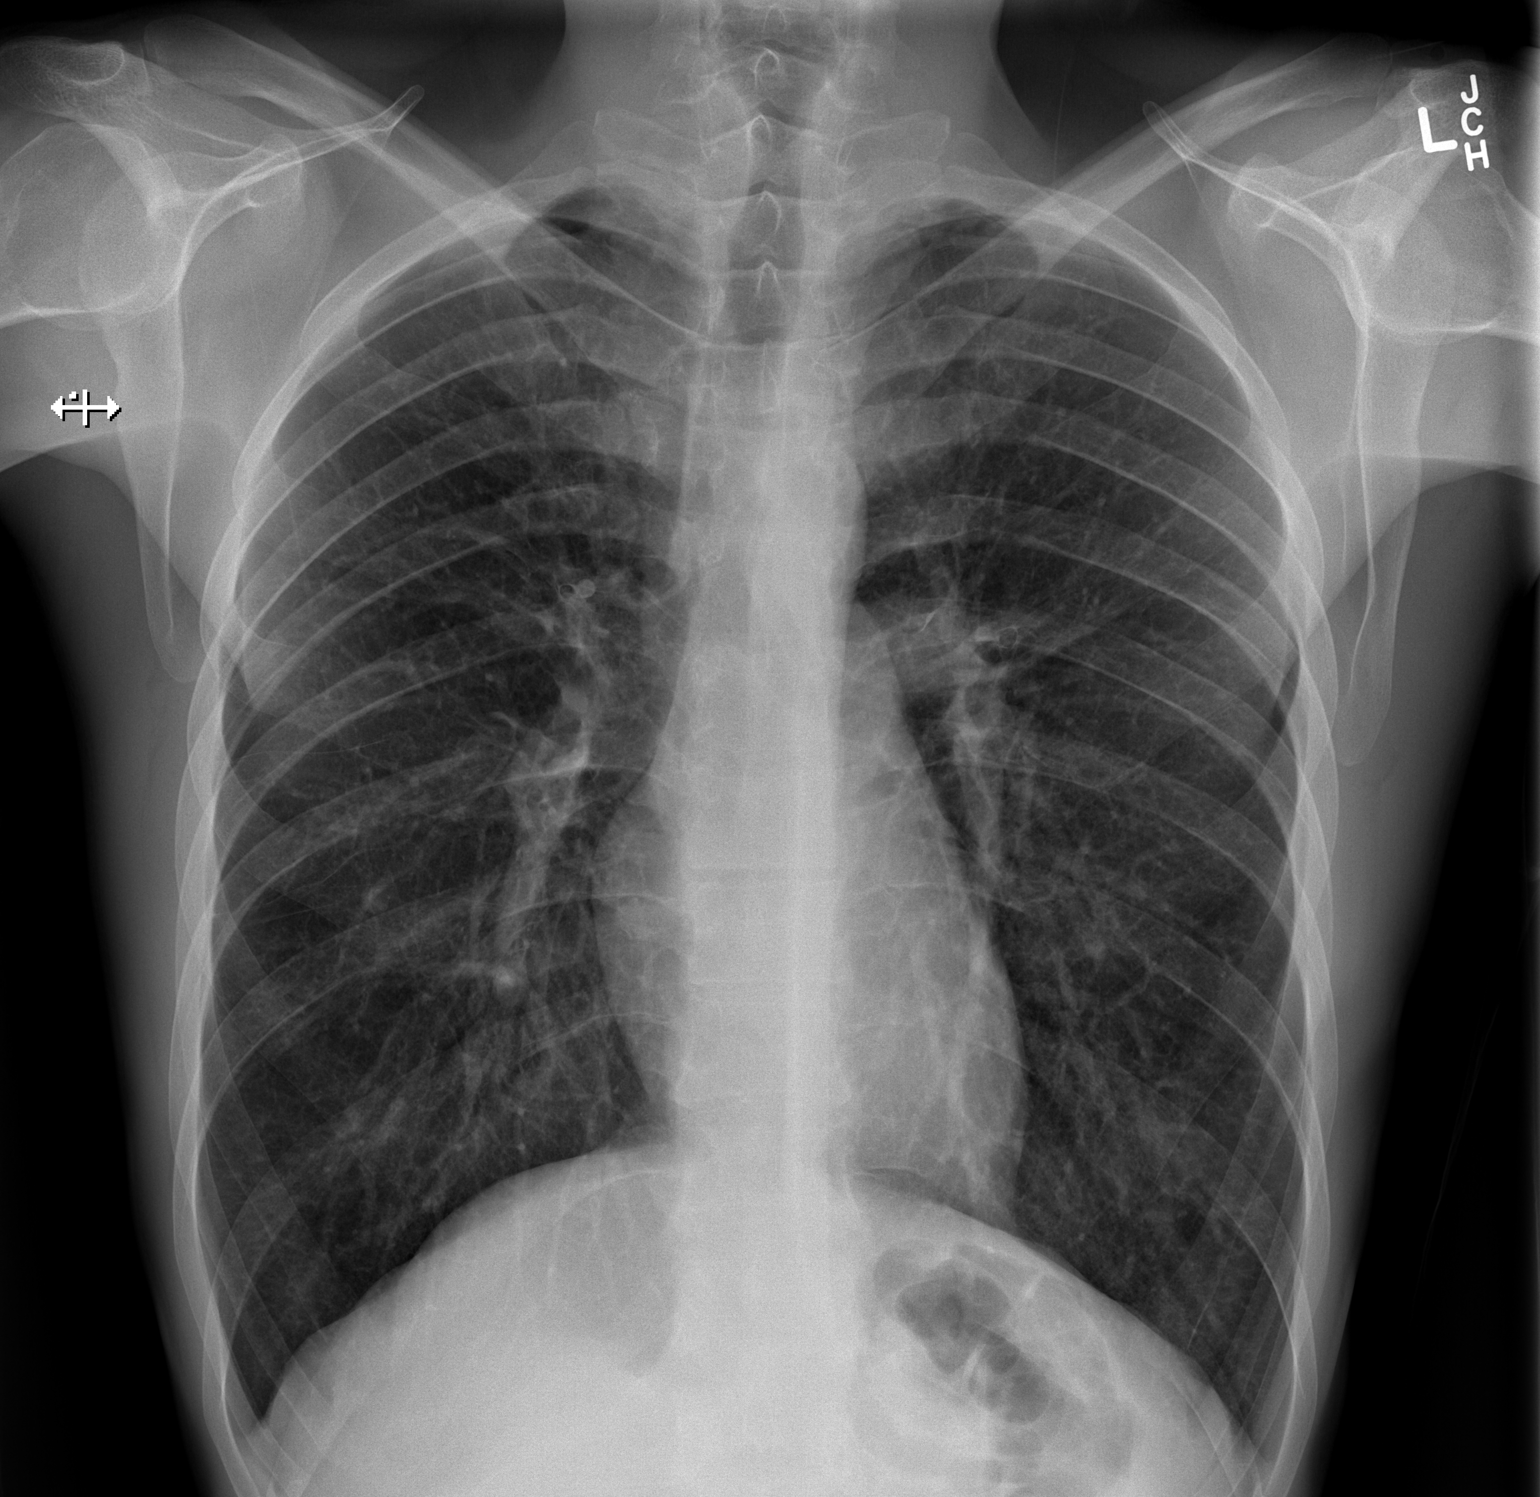

[w chest lat]
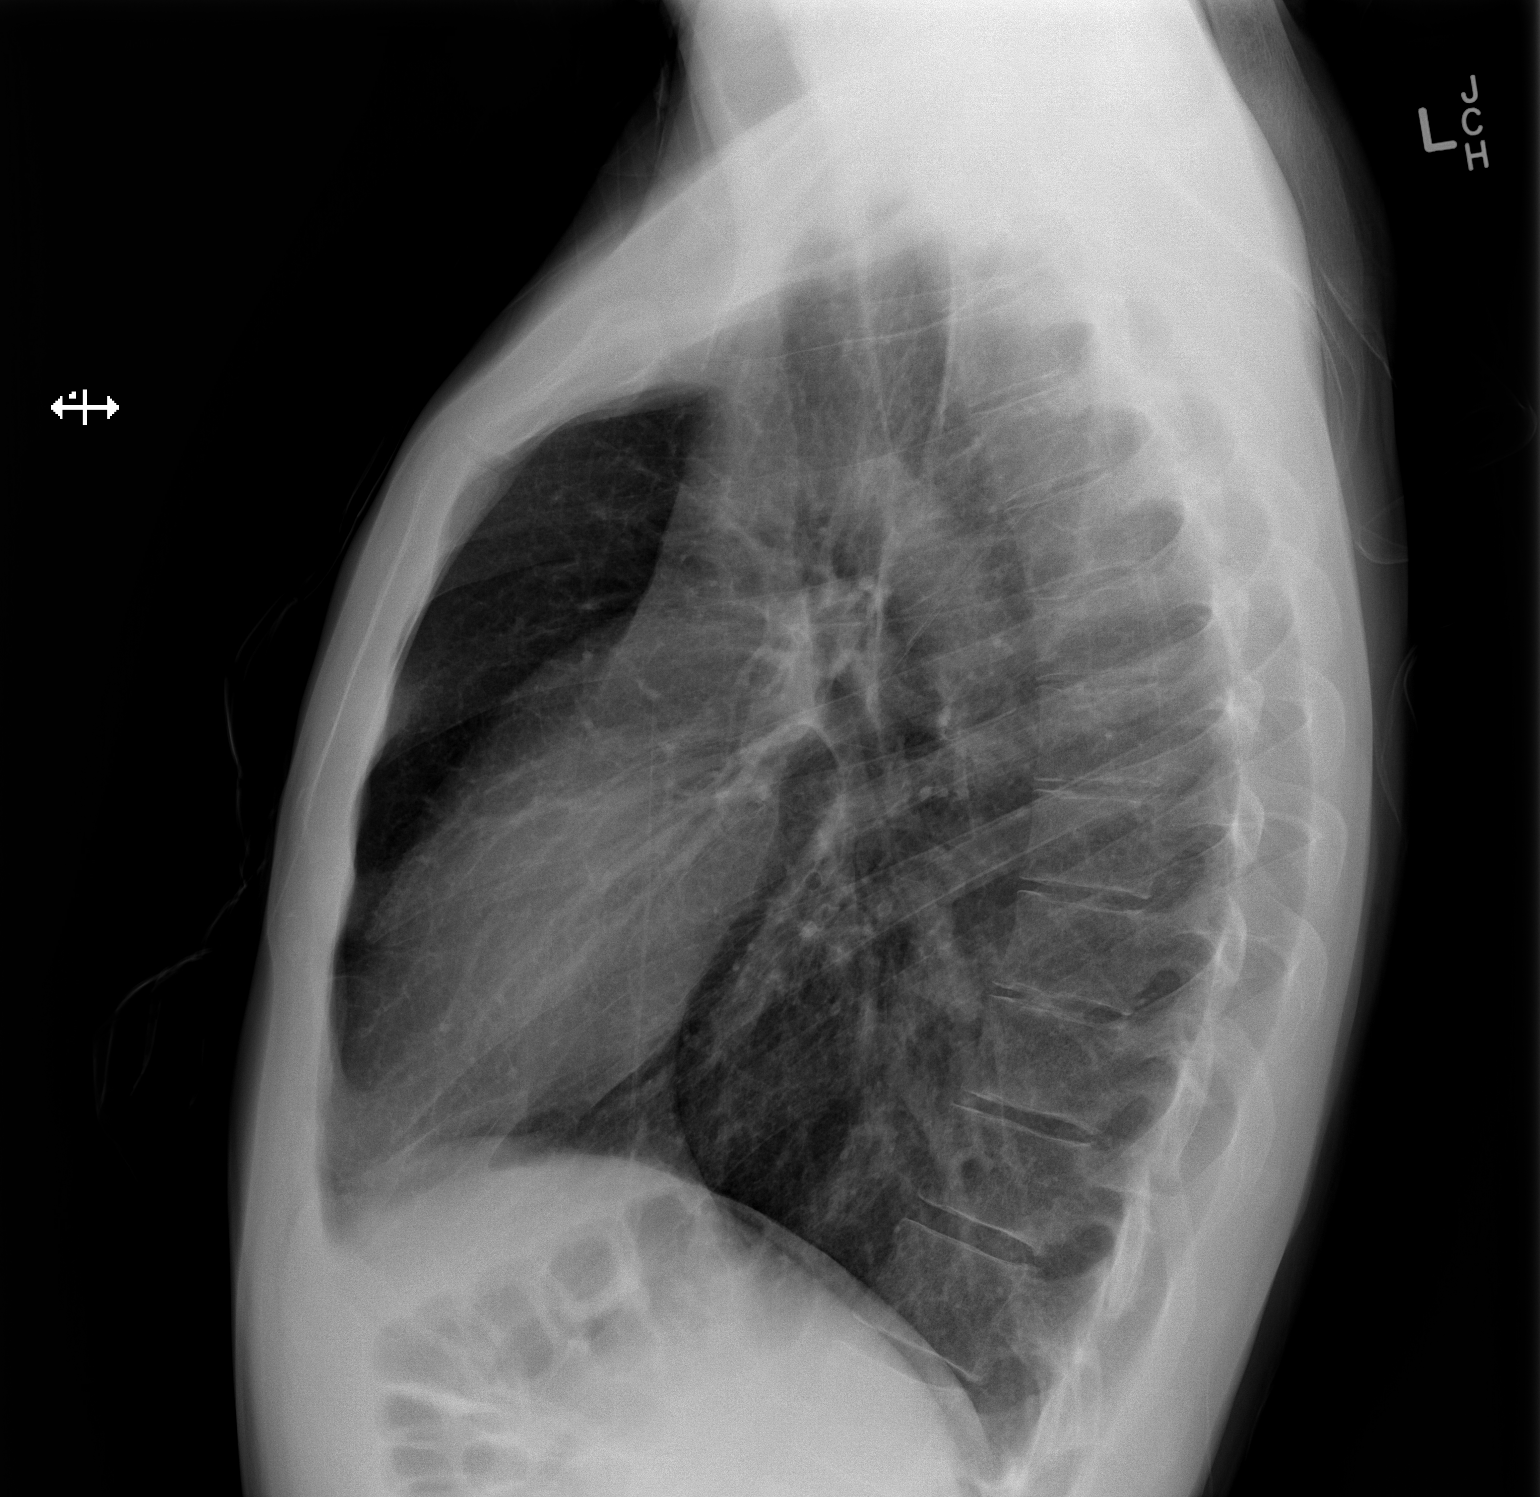

[2 of 2 positions shown; findings below may reference images not displayed]

FINDINGS: Normal mediastinum and cardiac silhouette. Normal pulmonary
vasculature. No evidence of effusion, infiltrate, or pneumothorax.
No acute bony abnormality.
IMPRESSION: No acute cardiopulmonary process.

## 2018-01-23 ENCOUNTER — Emergency Department: Admit: 2018-01-24 | Payer: MEDICAID

## 2018-01-23 DIAGNOSIS — K8010 Calculus of gallbladder with chronic cholecystitis without obstruction: Secondary | ICD-10-CM

## 2018-01-23 DIAGNOSIS — K8020 Calculus of gallbladder without cholecystitis without obstruction: Secondary | ICD-10-CM

## 2018-01-23 NOTE — ED Notes (Signed)
Pt discharged. No questions at this time.

## 2018-01-23 NOTE — ED Notes (Signed)
Pt off the floor to Ultrasound

## 2018-01-23 NOTE — ED Notes (Signed)
[facility] called for update on pt. Per pt Okay to release information.

## 2018-01-23 NOTE — ED Triage Notes (Signed)
Pt arrived Via EMS c/o liver pain 7/10. Recent dx of hep.

## 2018-01-23 NOTE — ED Provider Notes (Signed)
Emergency Department Treatment Report    Patient: Ian Franklin Age: 32 y.o. Sex: male    Date of Birth: [birth year] Admit Date: 01/23/2018 PCP: None   MRN: [id number]  CSN: [id number]     Room: ER14/14 Time Dictated: 8:40 PM      Chief Complaint   Chief Complaint   Patient presents with   ??? Abdominal Pain       History of Present Illness   32 y.o. male, PMH Hep C, recovering opioid abuser, presents with with several hours of worsening right upper quadrant abdominal pain and nausea.  He was recently diagnosed with Hep C, but has not followed up with a primary care physician or gastroenterologist.  He denies vomiting, fevers, urinary symptoms, back pain, headache or hematemesis.    Review of Systems   Constitutional: No fever, chills, or weight loss  Eyes: No visual symptoms.  ENT: No sore throat, runny nose or ear pain.  Respiratory: No cough, dyspnea or wheezing.  Cardiovascular: No chest pain, pressure, palpitations, tightness or heaviness.  Gastrointestinal: +RUQ pain, nausea  Genitourinary: No dysuria, frequency, or urgency.  Musculoskeletal: No joint pain or swelling.  Integumentary: No rashes.  Neurological: No headaches, sensory or motor symptoms.  Denies complaints in all other systems.    Past Medical/Surgical History   PMHx: Hep C  Social History     Social History     Socioeconomic History   ??? Marital status: SINGLE     Spouse name: Not on file   ??? Number of children: Not on file   ??? Years of education: Not on file   ??? Highest education level: Not on file       Family History   No family history on file.    Home Medications     None       Allergies   No Known Allergies    Physical Exam     ED Triage Vitals   ED Encounter Vitals Group      BP       Pulse       Resp       Temp       Temp src       SpO2       Weight       Height      Constitutional: Patient appears well developed and well nourished. Appearance and behavior are age and situation appropriate.   HEENT: Conjunctiva clear.  PERRLA. Mucous membranes moist, non-erythematous. Surface of the pharynx, palate, and tongue are pink, moist and without lesions.  Neck: supple, non tender, symmetrical, no masses or JVD.   Respiratory: lungs clear to auscultation, nonlabored respirations. No tachypnea or accessory muscle use.  Cardiovascular: heart regular rate and rhythm without murmur rubs or gallops.   Calves soft and non-tender. Distal pulses 2+ and equal bilaterally.  No peripheral edema.   Gastrointestinal:  Abdomen soft, mild right upper quadrant abdominal pain without rebound or guarding, negative Murphy's sign  Musculoskeletal: Nail beds pink with prompt capillary refill  Integumentary: warm and dry without rashes or lesions  Neurologic: alert and oriented, Sensation intact, motor strength equal and symmetric.  No facial asymmetry or dysarthria.    Diagnostic Studies   Lab:   Recent Results (from the past 12 hour(s))   CBC WITH AUTOMATED DIFF    Collection Time: 01/23/18  8:55 PM   Result Value Ref Range    WBC 14.2 (H) 4.6 - 13.2 K/uL  RBC 4.36 (L) 4.70 - 5.50 M/uL    HGB 12.5 (L) 13.0 - 16.0 g/dL    HCT 38.0 36.0 - 48.0 %    MCV 87.2 74.0 - 97.0 FL    MCH 28.7 24.0 - 34.0 PG    MCHC 32.9 31.0 - 37.0 g/dL    RDW 16.6 (H) 11.6 - 14.5 %    PLATELET 271 135 - 420 K/uL    MPV 10.4 9.2 - 11.8 FL    NEUTROPHILS 60 40 - 73 %    LYMPHOCYTES 30 21 - 52 %    MONOCYTES 7 3 - 10 %    EOSINOPHILS 3 0 - 5 %    BASOPHILS 0 0 - 2 %    ABS. NEUTROPHILS 8.4 (H) 1.8 - 8.0 K/UL    ABS. LYMPHOCYTES 4.3 (H) 0.9 - 3.6 K/UL    ABS. MONOCYTES 1.0 0.05 - 1.2 K/UL    ABS. EOSINOPHILS 0.5 (H) 0.0 - 0.4 K/UL    ABS. BASOPHILS 0.1 0.0 - 0.1 K/UL    DF AUTOMATED     METABOLIC PANEL, BASIC    Collection Time: 01/23/18  8:55 PM   Result Value Ref Range    Sodium 147 (H) 136 - 145 mmol/L    Potassium 3.8 3.5 - 5.5 mmol/L    Chloride 111 100 - 111 mmol/L    CO2 26 21 - 32 mmol/L    Anion gap 10 3.0 - 18 mmol/L    Glucose 114 (H) 74 - 99 mg/dL     BUN 13 7.0 - 18 MG/DL    Creatinine 1.02 0.6 - 1.3 MG/DL    BUN/Creatinine ratio 13 12 - 20      GFR est AA >60 >60 ml/min/1.68m    GFR est non-AA >60 >60 ml/min/1.76m   Calcium 8.8 8.5 - 10.1 MG/DL   HEPATIC FUNCTION PANEL    Collection Time: 01/23/18  8:55 PM   Result Value Ref Range    Protein, total 6.2 (L) 6.4 - 8.2 g/dL    Albumin 3.2 (L) 3.4 - 5.0 g/dL    Globulin 3.0 2.0 - 4.0 g/dL    A-G Ratio 1.1 0.8 - 1.7      Bilirubin, total 0.3 0.2 - 1.0 MG/DL    Bilirubin, direct <0.1 0.0 - 0.2 MG/DL    Alk. phosphatase 161 (H) 45 - 117 U/L    AST (SGOT) 128 (H) 10 - 38 U/L    ALT (SGPT) 330 (H) 16 - 61 U/L   LIPASE    Collection Time: 01/23/18  8:55 PM   Result Value Ref Range    Lipase 189 73 - 393 U/L       Imaging:    [employer]    Result Date: 01/23/2018  Ultrasound abdomen limited HISTORY: Hepatitis C. Right upper quadrant pain. Epigastric pain. COMPARISON: None FINDINGS: Multiple images from a limited abdomen ultrasound study Pancreas not well visualized due to bowel gas. The liver measures 16.2 cm in length. Visualized portions of the liver are without discrete mass. No intrahepatic duct dilatation. Main portal vein is patent with proper directional flow. It measures 1.4 cm in diameter. The right kidney measures 10.3 cm in length. Slightly limited by rib shadowing. No solid or cystic renal mass. No hydronephrosis. The gallbladder contains multiple stones. It is partially contracted. The wall measures 3.2 mm in thickness. No pericholecystic fluid. The common bile duct measures 5 mm. No sonographic Murphy sign. No free  fluid identified.     IMPRESSION: Gallstones in a mildly contracted gallbladder. No definite ultrasound findings for acute cholecystitis. Main portal vein slightly dilated. Correlate for potential portal hypertension. Limited visualization of the pancreas    '@IMAGES' @    ED Course/Medical Decision Making   Patient presents with intermittent right upper quadrant abdominal pain and  nausea.  Ultrasound does not reveal any evidence of acute cholecystitis and his bilirubin also is of cholecystitis.  He is afebrile, but does have a slight leukocytosis.  No signs of infection.  He is feeling much better and tolerating oral fluids.  Instructed him to follow-up with surgery to discuss a possible cholecystectomy.  Also provided him follow-up with a hepatologist to help with his Hhep C.  Given very strict return precautions, especially about cholecystitis, and he understands.    Medications   morphine 10 mg/mL injection 8 mg (8 mg IntraVENous Given 01/23/18 2216)   ondansetron (ZOFRAN) injection 4 mg (4 mg IntraVENous Given 01/23/18 2216)       Final Diagnosis       ICD-10-CM ICD-9-CM   1. Calculus of gallbladder without cholecystitis without obstruction K80.20 574.20   2. Elevated LFTs R94.5 790.6       Disposition   Patient is discharged home in stable condition.  Advised to follow with their primary care physician.  Patient advised to return to the ER for any new or worsening symptoms.       My Medications      START taking these medications      Instructions Each Dose to Equal Morning Noon Evening Bedtime   ondansetron hcl 4 mg tablet  Commonly known as:  ZOFRAN    Your last dose was:      Your next dose is:          Take 1 Tab by mouth every eight (8) hours as needed for Nausea.   4 mg                       Where to Get Your Medications      Information on where to get these meds will be given to you by the nurse or doctor.    Ask your nurse or doctor about these medications  ?? ondansetron hcl 4 mg tablet           Filbert Schilder, MD  January 23, 2018    My signature above authenticates this document and my orders, the final    diagnosis (es), discharge prescription (s), and instructions in the Epic    record.  If you have any questions please contact [phone].     Nursing notes have been reviewed by the physician/ advanced practice    Clinician.     Disclaimer: Sections of this note are dictated using utilizing voice recognition software.  Minor typographical errors may be present. If questions arise, please do not hesitate to contact me or call our department.

## 2018-01-23 NOTE — ED Notes (Signed)
Pt arrived Via EMS c/o liver pain 7/10. Recent dx of hep.

## 2018-01-23 NOTE — ED Notes (Signed)
[facility] called for update on pt. Per pt Okay to release information.

## 2018-01-23 NOTE — ED Notes (Signed)
Pt discharged. No questions at this time.

## 2018-01-23 NOTE — ED Notes (Signed)
Pt off the floor to Ultrasound.

## 2018-01-23 NOTE — ED Provider Notes (Signed)
Emergency Department Treatment Report    Patient: Ian Franklin Age: 32 y.o. Sex: male    Date of Birth: [birth year] Admit Date: 01/23/2018 PCP: None   MRN: [id number]  CSN: [id number]     Room: ER14/14 Time Dictated: 8:40 PM      Chief Complaint   Chief Complaint   Patient presents with   ??? Abdominal Pain       History of Present Illness   32 y.o. male, PMH Hep C, recovering opioid abuser, presents with with several hours of worsening right upper quadrant abdominal pain and nausea.  He was recently diagnosed with Hep C, but has not followed up with a primary care physician or gastroenterologist.  He denies vomiting, fevers, urinary symptoms, back pain, headache or hematemesis.    Review of Systems   Constitutional: No fever, chills, or weight loss  Eyes: No visual symptoms.  ENT: No sore throat, runny nose or ear pain.  Respiratory: No cough, dyspnea or wheezing.  Cardiovascular: No chest pain, pressure, palpitations, tightness or heaviness.  Gastrointestinal: +RUQ pain, nausea  Genitourinary: No dysuria, frequency, or urgency.  Musculoskeletal: No joint pain or swelling.  Integumentary: No rashes.  Neurological: No headaches, sensory or motor symptoms.  Denies complaints in all other systems.    Past Medical/Surgical History   PMHx: Hep C  Social History     Social History     Socioeconomic History   ??? Marital status: SINGLE     Spouse name: Not on file   ??? Number of children: Not on file   ??? Years of education: Not on file   ??? Highest education level: Not on file       Family History   No family history on file.    Home Medications     None       Allergies   No Known Allergies    Physical Exam     ED Triage Vitals   ED Encounter Vitals Group      BP       Pulse       Resp       Temp       Temp src       SpO2       Weight       Height      Constitutional: Patient appears well developed and well nourished. Appearance and behavior are age and situation appropriate.  HEENT: Conjunctiva clear.  PERRLA. Mucous  membranes moist, non-erythematous. Surface of the pharynx, palate, and tongue are pink, moist and without lesions.  Neck: supple, non tender, symmetrical, no masses or JVD.   Respiratory: lungs clear to auscultation, nonlabored respirations. No tachypnea or accessory muscle use.  Cardiovascular: heart regular rate and rhythm without murmur rubs or gallops.   Calves soft and non-tender. Distal pulses 2+ and equal bilaterally.  No peripheral edema.   Gastrointestinal:  Abdomen soft, mild right upper quadrant abdominal pain without rebound or guarding, negative Murphy's sign  Musculoskeletal: Nail beds pink with prompt capillary refill  Integumentary: warm and dry without rashes or lesions  Neurologic: alert and oriented, Sensation intact, motor strength equal and symmetric.  No facial asymmetry or dysarthria.    Diagnostic Studies   Lab:   Recent Results (from the past 12 hour(s))   CBC WITH AUTOMATED DIFF    Collection Time: 01/23/18  8:55 PM   Result Value Ref Range    WBC 14.2 (H) 4.6 - 13.2 K/uL  RBC 4.36 (L) 4.70 - 5.50 M/uL    HGB 12.5 (L) 13.0 - 16.0 g/dL    HCT 38.0 36.0 - 48.0 %    MCV 87.2 74.0 - 97.0 FL    MCH 28.7 24.0 - 34.0 PG    MCHC 32.9 31.0 - 37.0 g/dL    RDW 16.6 (H) 11.6 - 14.5 %    PLATELET 271 135 - 420 K/uL    MPV 10.4 9.2 - 11.8 FL    NEUTROPHILS 60 40 - 73 %    LYMPHOCYTES 30 21 - 52 %    MONOCYTES 7 3 - 10 %    EOSINOPHILS 3 0 - 5 %    BASOPHILS 0 0 - 2 %    ABS. NEUTROPHILS 8.4 (H) 1.8 - 8.0 K/UL    ABS. LYMPHOCYTES 4.3 (H) 0.9 - 3.6 K/UL    ABS. MONOCYTES 1.0 0.05 - 1.2 K/UL    ABS. EOSINOPHILS 0.5 (H) 0.0 - 0.4 K/UL    ABS. BASOPHILS 0.1 0.0 - 0.1 K/UL    DF AUTOMATED     METABOLIC PANEL, BASIC    Collection Time: 01/23/18  8:55 PM   Result Value Ref Range    Sodium 147 (H) 136 - 145 mmol/L    Potassium 3.8 3.5 - 5.5 mmol/L    Chloride 111 100 - 111 mmol/L    CO2 26 21 - 32 mmol/L    Anion gap 10 3.0 - 18 mmol/L    Glucose 114 (H) 74 - 99 mg/dL    BUN 13 7.0 - 18 MG/DL    Creatinine 1.02  0.6 - 1.3 MG/DL    BUN/Creatinine ratio 13 12 - 20      GFR est AA >60 >60 ml/min/1.25m    GFR est non-AA >60 >60 ml/min/1.771m   Calcium 8.8 8.5 - 10.1 MG/DL   HEPATIC FUNCTION PANEL    Collection Time: 01/23/18  8:55 PM   Result Value Ref Range    Protein, total 6.2 (L) 6.4 - 8.2 g/dL    Albumin 3.2 (L) 3.4 - 5.0 g/dL    Globulin 3.0 2.0 - 4.0 g/dL    A-G Ratio 1.1 0.8 - 1.7      Bilirubin, total 0.3 0.2 - 1.0 MG/DL    Bilirubin, direct <0.1 0.0 - 0.2 MG/DL    Alk. phosphatase 161 (H) 45 - 117 U/L    AST (SGOT) 128 (H) 10 - 38 U/L    ALT (SGPT) 330 (H) 16 - 61 U/L   LIPASE    Collection Time: 01/23/18  8:55 PM   Result Value Ref Range    Lipase 189 73 - 393 U/L       Imaging:    [employer]    Result Date: 01/23/2018  Ultrasound abdomen limited HISTORY: Hepatitis C. Right upper quadrant pain. Epigastric pain. COMPARISON: None FINDINGS: Multiple images from a limited abdomen ultrasound study Pancreas not well visualized due to bowel gas. The liver measures 16.2 cm in length. Visualized portions of the liver are without discrete mass. No intrahepatic duct dilatation. Main portal vein is patent with proper directional flow. It measures 1.4 cm in diameter. The right kidney measures 10.3 cm in length. Slightly limited by rib shadowing. No solid or cystic renal mass. No hydronephrosis. The gallbladder contains multiple stones. It is partially contracted. The wall measures 3.2 mm in thickness. No pericholecystic fluid. The common bile duct measures 5 mm. No sonographic Murphy sign. No free  fluid identified.     IMPRESSION: Gallstones in a mildly contracted gallbladder. No definite ultrasound findings for acute cholecystitis. Main portal vein slightly dilated. Correlate for potential portal hypertension. Limited visualization of the pancreas    '@IMAGES' @    ED Course/Medical Decision Making   Patient presents with intermittent right upper quadrant abdominal pain and nausea.  Ultrasound does not reveal any evidence of acute  cholecystitis and his bilirubin also is of cholecystitis.  He is afebrile, but does have a slight leukocytosis.  No signs of infection.  He is feeling much better and tolerating oral fluids.  Instructed him to follow-up with surgery to discuss a possible cholecystectomy.  Also provided him follow-up with a hepatologist to help with his Hhep C.  Given very strict return precautions, especially about cholecystitis, and he understands.    Medications   morphine 10 mg/mL injection 8 mg (8 mg IntraVENous Given 01/23/18 2216)   ondansetron (ZOFRAN) injection 4 mg (4 mg IntraVENous Given 01/23/18 2216)       Final Diagnosis       ICD-10-CM ICD-9-CM   1. Calculus of gallbladder without cholecystitis without obstruction K80.20 574.20   2. Elevated LFTs R94.5 790.6       Disposition   Patient is discharged home in stable condition.  Advised to follow with their primary care physician.  Patient advised to return to the ER for any new or worsening symptoms.       My Medications      START taking these medications      Instructions Each Dose to Equal Morning Noon Evening Bedtime   ondansetron hcl 4 mg tablet  Commonly known as:  ZOFRAN    Your last dose was:      Your next dose is:          Take 1 Tab by mouth every eight (8) hours as needed for Nausea.   4 mg                       Where to Get Your Medications      Information on where to get these meds will be given to you by the nurse or doctor.    Ask your nurse or doctor about these medications  ?? ondansetron hcl 4 mg tablet           Filbert Schilder, MD  January 23, 2018    My signature above authenticates this document and my orders, the final    diagnosis (es), discharge prescription (s), and instructions in the Epic    record.  If you have any questions please contact [phone].     Nursing notes have been reviewed by the physician/ advanced practice    Clinician.    Disclaimer: Sections of this note are dictated using utilizing voice recognition software.  Minor  typographical errors may be present. If questions arise, please do not hesitate to contact me or call our department.

## 2018-01-24 ENCOUNTER — Inpatient Hospital Stay
Admit: 2018-01-24 | Discharge: 2018-01-24 | Disposition: A | Discharge status: Home / Self Care | Payer: MEDICAID | Attending: Emergency Medicine

## 2018-01-24 LAB — METABOLIC PANEL, BASIC
Anion gap: 10 mmol/L (ref 3.0–18)
BUN/Creatinine ratio: 13 (ref 12–20)
BUN: 13 MG/DL (ref 7.0–18)
CO2: 26 mmol/L (ref 21–32)
Calcium: 8.8 MG/DL (ref 8.5–10.1)
Chloride: 111 mmol/L (ref 100–111)
Creatinine: 1.02 MG/DL (ref 0.6–1.3)
GFR est AA: >60 mL/min/{1.73_m2} (ref >60)
GFR est non-AA: >60 mL/min/{1.73_m2} (ref >60)
Glucose: 114 mg/dL — ABNORMAL HIGH (ref 74–99)
Potassium: 3.8 mmol/L (ref 3.5–5.5)
Sodium: 147 mmol/L — ABNORMAL HIGH (ref 136–145)

## 2018-01-24 LAB — HEPATIC FUNCTION PANEL
A-G Ratio: 1.1 (ref 0.8–1.7)
ALT (SGPT): 330 U/L — ABNORMAL HIGH (ref 16–61)
ALT: 330 U/L — ABNORMAL HIGH (ref 16–61)
AST (SGOT): 128 U/L — ABNORMAL HIGH (ref 10–38)
AST: 128 U/L — ABNORMAL HIGH (ref 10–38)
Albumin/Globulin Ratio: 1.1 (ref 0.8–1.7)
Albumin: 3.2 g/dL — ABNORMAL LOW (ref 3.4–5.0)
Albumin: 3.2 g/dL — ABNORMAL LOW (ref 3.4–5.0)
Alk. phosphatase: 161 U/L — ABNORMAL HIGH (ref 45–117)
Alkaline Phosphatase: 161 U/L — ABNORMAL HIGH (ref 45–117)
Bilirubin, Direct: <0.1 MG/DL (ref 0.0–0.2)
Bilirubin, direct: <0.1 MG/DL (ref 0.0–0.2)
Bilirubin, total: 0.3 MG/DL (ref 0.2–1.0)
Globulin: 3 g/dL (ref 2.0–4.0)
Globulin: 3 g/dL (ref 2.0–4.0)
Protein, total: 6.2 g/dL — ABNORMAL LOW (ref 6.4–8.2)
Total Bilirubin: 0.3 MG/DL (ref 0.2–1.0)
Total Protein: 6.2 g/dL — ABNORMAL LOW (ref 6.4–8.2)

## 2018-01-24 LAB — CBC WITH AUTOMATED DIFF
ABS. BASOPHILS: 0.1 10*3/uL (ref 0.0–0.1)
ABS. EOSINOPHILS: 0.5 10*3/uL — ABNORMAL HIGH (ref 0.0–0.4)
ABS. LYMPHOCYTES: 4.3 10*3/uL — ABNORMAL HIGH (ref 0.9–3.6)
ABS. MONOCYTES: 1 10*3/uL (ref 0.05–1.2)
ABS. NEUTROPHILS: 8.4 10*3/uL — ABNORMAL HIGH (ref 1.8–8.0)
BASOPHILS: 0 % (ref 0–2)
EOSINOPHILS: 3 % (ref 0–5)
HCT: 38 % (ref 36.0–48.0)
HGB: 12.5 g/dL — ABNORMAL LOW (ref 13.0–16.0)
LYMPHOCYTES: 30 % (ref 21–52)
MCH: 28.7 PG (ref 24.0–34.0)
MCHC: 32.9 g/dL (ref 31.0–37.0)
MCV: 87.2 FL (ref 74.0–97.0)
MONOCYTES: 7 % (ref 3–10)
MPV: 10.4 FL (ref 9.2–11.8)
NEUTROPHILS: 60 % (ref 40–73)
PLATELET: 271 10*3/uL (ref 135–420)
RBC: 4.36 M/uL — ABNORMAL LOW (ref 4.70–5.50)
RDW: 16.6 % — ABNORMAL HIGH (ref 11.6–14.5)
WBC: 14.2 10*3/uL — ABNORMAL HIGH (ref 4.6–13.2)

## 2018-01-24 LAB — LIPASE
Lipase: 189 U/L (ref 73–393)
Lipase: 189 U/L (ref 73–393)

## 2018-01-24 LAB — CBC WITH AUTO DIFFERENTIAL
Basophils %: 0 % (ref 0–2)
Basophils Absolute: 0.1 10*3/uL (ref 0.0–0.1)
Eosinophils %: 3 % (ref 0–5)
Eosinophils Absolute: 0.5 10*3/uL — ABNORMAL HIGH (ref 0.0–0.4)
Hematocrit: 38 % (ref 36.0–48.0)
Hemoglobin: 12.5 g/dL — ABNORMAL LOW (ref 13.0–16.0)
Lymphocytes %: 30 % (ref 21–52)
Lymphocytes Absolute: 4.3 10*3/uL — ABNORMAL HIGH (ref 0.9–3.6)
MCH: 28.7 PG (ref 24.0–34.0)
MCHC: 32.9 g/dL (ref 31.0–37.0)
MCV: 87.2 FL (ref 74.0–97.0)
MPV: 10.4 FL (ref 9.2–11.8)
Monocytes %: 7 % (ref 3–10)
Monocytes Absolute: 1 10*3/uL (ref 0.05–1.2)
Neutrophils %: 60 % (ref 40–73)
Neutrophils Absolute: 8.4 10*3/uL — ABNORMAL HIGH (ref 1.8–8.0)
Platelets: 271 10*3/uL (ref 135–420)
RBC: 4.36 M/uL — ABNORMAL LOW (ref 4.70–5.50)
RDW: 16.6 % — ABNORMAL HIGH (ref 11.6–14.5)
WBC: 14.2 10*3/uL — ABNORMAL HIGH (ref 4.6–13.2)

## 2018-01-24 LAB — BASIC METABOLIC PANEL
Anion Gap: 10 mmol/L (ref 3.0–18)
BUN: 13 MG/DL (ref 7.0–18)
Bun/Cre Ratio: 13 (ref 12–20)
CO2: 26 mmol/L (ref 21–32)
Calcium: 8.8 MG/DL (ref 8.5–10.1)
Chloride: 111 mmol/L (ref 100–111)
Creatinine: 1.02 MG/DL (ref 0.6–1.3)
EGFR IF NonAfrican American: >60 mL/min/{1.73_m2} (ref >60)
GFR African American: >60 mL/min/{1.73_m2} (ref >60)
Glucose: 114 mg/dL — ABNORMAL HIGH (ref 74–99)
Potassium: 3.8 mmol/L (ref 3.5–5.5)
Sodium: 147 mmol/L — ABNORMAL HIGH (ref 136–145)

## 2018-01-24 MED ORDER — MORPHINE 10 MG/ML INJ SOLUTION
10 mg/ml | Freq: Once | INTRAMUSCULAR | Status: AC
Start: 2018-01-24 — End: 2018-01-23
  Administered 2018-01-24: 02:00:00 via INTRAVENOUS

## 2018-01-24 MED ORDER — ONDANSETRON (PF) 4 MG/2 ML INJECTION
4 mg/2 mL | INTRAMUSCULAR | Status: AC
Start: 2018-01-24 — End: 2018-01-23
  Administered 2018-01-24: 02:00:00 via INTRAVENOUS

## 2018-01-24 MED ORDER — ONDANSETRON HCL 4 MG TAB
4 mg | ORAL_TABLET | Freq: Three times a day (TID) | ORAL | 0 refills | Status: AC | PRN
Start: 2018-01-24 — End: ?

## 2018-01-24 MED FILL — MORPHINE 10 MG/ML IJ SOLN: 10 mg/mL | INTRAMUSCULAR | Qty: 1

## 2018-01-24 MED FILL — ONDANSETRON (PF) 4 MG/2 ML INJECTION: 4 mg/2 mL | INTRAMUSCULAR | Qty: 2

## 2018-01-25 ENCOUNTER — Inpatient Hospital Stay
Admit: 2018-01-25 | Discharge: 2018-01-29 | Disposition: A | Discharge status: Home / Self Care | Payer: MEDICAID | Attending: Surgery | Admitting: Surgery

## 2018-01-25 ENCOUNTER — Ambulatory Visit: Admit: 2018-01-25 | Payer: MEDICAID

## 2018-01-25 LAB — PTT: aPTT: 26.6 s (ref 23.0–36.4)

## 2018-01-25 LAB — CBC WITH AUTOMATED DIFF
ABS. BASOPHILS: 0.1 10*3/uL (ref 0.0–0.1)
ABS. EOSINOPHILS: 0.4 10*3/uL (ref 0.0–0.4)
ABS. LYMPHOCYTES: 2.7 10*3/uL (ref 0.9–3.6)
ABS. MONOCYTES: 0.8 10*3/uL (ref 0.05–1.2)
ABS. NEUTROPHILS: 6.5 10*3/uL (ref 1.8–8.0)
BASOPHILS: 1 % (ref 0–2)
EOSINOPHILS: 4 % (ref 0–5)
HCT: 42.1 % (ref 36.0–48.0)
HGB: 14.2 g/dL (ref 13.0–16.0)
LYMPHOCYTES: 25 % (ref 21–52)
MCH: 29.2 PG (ref 24.0–34.0)
MCHC: 33.7 g/dL (ref 31.0–37.0)
MCV: 86.4 FL (ref 74.0–97.0)
MONOCYTES: 8 % (ref 3–10)
MPV: 10.4 FL (ref 9.2–11.8)
NEUTROPHILS: 62 % (ref 40–73)
PLATELET: 268 10*3/uL (ref 135–420)
RBC: 4.87 M/uL (ref 4.70–5.50)
RDW: 16.5 % — ABNORMAL HIGH (ref 11.6–14.5)
WBC: 10.5 10*3/uL (ref 4.6–13.2)

## 2018-01-25 LAB — HEPATIC FUNCTION PANEL
A-G Ratio: 1.2 (ref 0.8–1.7)
ALT (SGPT): 281 U/L — ABNORMAL HIGH (ref 16–61)
ALT: 281 U/L — ABNORMAL HIGH (ref 16–61)
AST (SGOT): 98 U/L — ABNORMAL HIGH (ref 10–38)
AST: 98 U/L — ABNORMAL HIGH (ref 10–38)
Albumin/Globulin Ratio: 1.2 (ref 0.8–1.7)
Albumin: 3.7 g/dL (ref 3.4–5.0)
Albumin: 3.7 g/dL (ref 3.4–5.0)
Alk. phosphatase: 145 U/L — ABNORMAL HIGH (ref 45–117)
Alkaline Phosphatase: 145 U/L — ABNORMAL HIGH (ref 45–117)
Bilirubin, Direct: 0.1 MG/DL (ref 0.0–0.2)
Bilirubin, direct: 0.1 MG/DL (ref 0.0–0.2)
Bilirubin, total: 0.4 MG/DL (ref 0.2–1.0)
Globulin: 3.2 g/dL (ref 2.0–4.0)
Globulin: 3.2 g/dL (ref 2.0–4.0)
Protein, total: 6.9 g/dL (ref 6.4–8.2)
Total Bilirubin: 0.4 MG/DL (ref 0.2–1.0)
Total Protein: 6.9 g/dL (ref 6.4–8.2)

## 2018-01-25 LAB — METABOLIC PANEL, BASIC
Anion gap: 5 mmol/L (ref 3.0–18)
BUN/Creatinine ratio: 11 — ABNORMAL LOW (ref 12–20)
BUN: 10 MG/DL (ref 7.0–18)
CO2: 28 mmol/L (ref 21–32)
Calcium: 8.9 MG/DL (ref 8.5–10.1)
Chloride: 110 mmol/L (ref 100–111)
Creatinine: 0.94 MG/DL (ref 0.6–1.3)
GFR est AA: >60 mL/min/{1.73_m2} (ref >60)
GFR est non-AA: >60 mL/min/{1.73_m2} (ref >60)
Glucose: 92 mg/dL (ref 74–99)
Potassium: 4.1 mmol/L (ref 3.5–5.5)
Sodium: 143 mmol/L (ref 136–145)

## 2018-01-25 LAB — LIPASE
Lipase: 142 U/L (ref 73–393)
Lipase: 142 U/L (ref 73–393)

## 2018-01-25 LAB — PROTHROMBIN TIME + INR
INR: 0.9 (ref 0.8–1.2)
Prothrombin time: 11.6 s (ref 11.5–15.2)

## 2018-01-25 LAB — CBC WITH AUTO DIFFERENTIAL
Basophils %: 1 % (ref 0–2)
Basophils Absolute: 0.1 10*3/uL (ref 0.0–0.1)
Eosinophils %: 4 % (ref 0–5)
Eosinophils Absolute: 0.4 10*3/uL (ref 0.0–0.4)
Hematocrit: 42.1 % (ref 36.0–48.0)
Hemoglobin: 14.2 g/dL (ref 13.0–16.0)
Lymphocytes %: 25 % (ref 21–52)
Lymphocytes Absolute: 2.7 10*3/uL (ref 0.9–3.6)
MCH: 29.2 PG (ref 24.0–34.0)
MCHC: 33.7 g/dL (ref 31.0–37.0)
MCV: 86.4 FL (ref 74.0–97.0)
MPV: 10.4 FL (ref 9.2–11.8)
Monocytes %: 8 % (ref 3–10)
Monocytes Absolute: 0.8 10*3/uL (ref 0.05–1.2)
Neutrophils %: 62 % (ref 40–73)
Neutrophils Absolute: 6.5 10*3/uL (ref 1.8–8.0)
Platelets: 268 10*3/uL (ref 135–420)
RBC: 4.87 M/uL (ref 4.70–5.50)
RDW: 16.5 % — ABNORMAL HIGH (ref 11.6–14.5)
WBC: 10.5 10*3/uL (ref 4.6–13.2)

## 2018-01-25 LAB — BASIC METABOLIC PANEL
Anion Gap: 5 mmol/L (ref 3.0–18)
BUN: 10 MG/DL (ref 7.0–18)
Bun/Cre Ratio: 11 — ABNORMAL LOW (ref 12–20)
CO2: 28 mmol/L (ref 21–32)
Calcium: 8.9 MG/DL (ref 8.5–10.1)
Chloride: 110 mmol/L (ref 100–111)
Creatinine: 0.94 MG/DL (ref 0.6–1.3)
EGFR IF NonAfrican American: >60 mL/min/{1.73_m2} (ref >60)
GFR African American: >60 mL/min/{1.73_m2} (ref >60)
Glucose: 92 mg/dL (ref 74–99)
Potassium: 4.1 mmol/L (ref 3.5–5.5)
Sodium: 143 mmol/L (ref 136–145)

## 2018-01-25 LAB — PROTIME-INR
INR: 0.9 (ref 0.8–1.2)
Protime: 11.6 s (ref 11.5–15.2)

## 2018-01-25 LAB — APTT: aPTT: 26.6 s (ref 23.0–36.4)

## 2018-01-25 MED ORDER — SODIUM CHLORIDE 0.9 % IJ SYRG
Freq: Three times a day (TID) | INTRAMUSCULAR | Status: DC
Start: 2018-01-25 — End: 2018-01-29
  Administered 2018-01-26 – 2018-01-29 (×8): via INTRAVENOUS

## 2018-01-25 MED ORDER — NALOXONE 0.4 MG/ML INJECTION
0.4 mg/mL | INTRAMUSCULAR | Status: DC | PRN
Start: 2018-01-25 — End: 2018-01-29

## 2018-01-25 MED ORDER — OXYCODONE-ACETAMINOPHEN 5 MG-325 MG TAB
5-325 mg | ORAL | Status: DC | PRN
Start: 2018-01-25 — End: 2018-01-29
  Administered 2018-01-26 – 2018-01-29 (×13): via ORAL

## 2018-01-25 MED ORDER — SODIUM CHLORIDE 0.9 % IJ SYRG
INTRAMUSCULAR | Status: DC | PRN
Start: 2018-01-25 — End: 2018-01-29
  Administered 2018-01-26 – 2018-01-27 (×2): via INTRAVENOUS

## 2018-01-25 MED ORDER — LACTATED RINGERS IV
INTRAVENOUS | Status: DC
Start: 2018-01-25 — End: 2018-01-29
  Administered 2018-01-26 – 2018-01-28 (×6): via INTRAVENOUS

## 2018-01-25 MED ORDER — HYDROMORPHONE 1 MG/ML INJECTION SOLUTION
1 mg/mL | INTRAMUSCULAR | Status: DC | PRN
Start: 2018-01-25 — End: 2018-01-29
  Administered 2018-01-25 – 2018-01-29 (×13): via INTRAVENOUS

## 2018-01-25 MED ORDER — ONDANSETRON (PF) 4 MG/2 ML INJECTION
4 mg/2 mL | INTRAMUSCULAR | Status: DC | PRN
Start: 2018-01-25 — End: 2018-01-29

## 2018-01-25 MED ORDER — HEPARIN (PORCINE) 5,000 UNIT/ML IJ SOLN
5000 unit/mL | Freq: Three times a day (TID) | INTRAMUSCULAR | Status: DC
Start: 2018-01-25 — End: 2018-01-29
  Administered 2018-01-25 – 2018-01-29 (×8): via SUBCUTANEOUS

## 2018-01-25 MED ORDER — DIPHENHYDRAMINE HCL 50 MG/ML IJ SOLN
50 mg/mL | INTRAMUSCULAR | Status: DC | PRN
Start: 2018-01-25 — End: 2018-01-29
  Administered 2018-01-28: 15:00:00 via INTRAVENOUS

## 2018-01-25 MED ORDER — SODIUM CHLORIDE 0.9% BOLUS IV
0.9 % | Freq: Once | INTRAVENOUS | Status: AC
Start: 2018-01-25 — End: 2018-01-25
  Administered 2018-01-25: 16:00:00 via INTRAVENOUS

## 2018-01-25 MED ORDER — MORPHINE 2 MG/ML INJECTION
2 mg/mL | INTRAMUSCULAR | Status: AC
Start: 2018-01-25 — End: 2018-01-25
  Administered 2018-01-25: 20:00:00 via INTRAVENOUS

## 2018-01-25 MED ORDER — SODIUM CHLORIDE 0.9% BOLUS IV
0.9 % | Freq: Once | INTRAVENOUS | Status: AC
Start: 2018-01-25 — End: 2018-01-25
  Administered 2018-01-25: 20:00:00 via INTRAVENOUS

## 2018-01-25 MED ORDER — ONDANSETRON (PF) 4 MG/2 ML INJECTION
4 mg/2 mL | INTRAMUSCULAR | Status: AC
Start: 2018-01-25 — End: 2018-01-25
  Administered 2018-01-25: 16:00:00 via INTRAVENOUS

## 2018-01-25 MED ORDER — MORPHINE 4 MG/ML SYRINGE
4 mg/mL | INTRAMUSCULAR | Status: AC
Start: 2018-01-25 — End: 2018-01-25
  Administered 2018-01-25: 16:00:00 via INTRAVENOUS

## 2018-01-25 MED ORDER — KETOROLAC TROMETHAMINE 15 MG/ML INJECTION
15 mg/mL | Freq: Once | INTRAMUSCULAR | Status: AC
Start: 2018-01-25 — End: 2018-01-25
  Administered 2018-01-25: 18:00:00 via INTRAVENOUS

## 2018-01-25 MED ORDER — MORPHINE 4 MG/ML SYRINGE
4 mg/mL | INTRAMUSCULAR | Status: AC
Start: 2018-01-25 — End: 2018-01-25
  Administered 2018-01-25: 17:00:00 via INTRAVENOUS

## 2018-01-25 MED ORDER — MORPHINE 10 MG/ML INJ SOLUTION
10 mg/ml | INTRAMUSCULAR | Status: AC
Start: 2018-01-25 — End: 2018-01-25
  Administered 2018-01-25: 19:00:00 via INTRAVENOUS

## 2018-01-25 MED ORDER — LORAZEPAM 2 MG/ML IJ SOLN
2 mg/mL | Freq: Three times a day (TID) | INTRAMUSCULAR | Status: DC | PRN
Start: 2018-01-25 — End: 2018-01-29

## 2018-01-25 MED ORDER — SODIUM CHLORIDE 0.9% BOLUS IV
0.9 % | Freq: Once | INTRAVENOUS | Status: AC
Start: 2018-01-25 — End: 2018-01-26

## 2018-01-25 MED FILL — MORPHINE 10 MG/ML IJ SOLN: 10 mg/mL | INTRAMUSCULAR | Qty: 1

## 2018-01-25 MED FILL — HYDROMORPHONE 1 MG/ML INJECTION SOLUTION: 1 mg/mL | INTRAMUSCULAR | Qty: 1

## 2018-01-25 MED FILL — MORPHINE 4 MG/ML SYRINGE: 4 mg/mL | INTRAMUSCULAR | Qty: 1

## 2018-01-25 MED FILL — SODIUM CHLORIDE 0.9 % IV: INTRAVENOUS | Qty: 1000

## 2018-01-25 MED FILL — BD POSIFLUSH NORMAL SALINE 0.9 % INJECTION SYRINGE: INTRAMUSCULAR | Qty: 40

## 2018-01-25 MED FILL — LACTATED RINGERS IV: INTRAVENOUS | Qty: 1000

## 2018-01-25 MED FILL — KETOROLAC TROMETHAMINE 15 MG/ML INJECTION: 15 mg/mL | INTRAMUSCULAR | Qty: 1

## 2018-01-25 MED FILL — MORPHINE 2 MG/ML INJECTION: 2 mg/mL | INTRAMUSCULAR | Qty: 1

## 2018-01-25 MED FILL — HEPARIN (PORCINE) 5,000 UNIT/ML IJ SOLN: 5000 unit/mL | INTRAMUSCULAR | Qty: 1

## 2018-01-25 MED FILL — ONDANSETRON (PF) 4 MG/2 ML INJECTION: 4 mg/2 mL | INTRAMUSCULAR | Qty: 2

## 2018-01-25 NOTE — Other (Signed)
Bedside and Verbal shift change report given to Haze Rushing, RN (oncoming nurse) by Zenaida Deed, RN (offgoing nurse). Report included the following information Kardex and MAR.     End of Shift Note     Bedside and verbal shift change report given to Georgiann Mohs, RN (On coming nurse) by Haze Rushing, RN (Off going nurse).  Report included the following information:Procedure Summary, Intake/Output, MAR, Recent Results, Med Rec Status, and SBAR  (Situation, Background, Assessment and Recommendations)    SBAR Recommendations: NPO for Hidden scan    Issues for Provider to address non noted        Activity This Shift      []  Resting in bed   []  TDWB   []  Dangled     []  Chair Ambulated to     [x]  Bathroom     []  BSC     []  Refused Ambulated In    []  Room    []  Hallway    []  Bedrest ordered   Bladder Scan  Voiding Status []  Yes  [x]  Void [x]  No  []  Foley []  N/A  []  I&O Cath   Blood Sugars Managed []  Yes [x]  No []  N/A   Bowel Movement  Passing Gas []  Yes  [x]  Yes [x]  No  []  No    CHG Bath Done     Before Surgery     After Surgery   [x]  Yes  [x]  Yes   []  No  []  No   []  N/A  []  N/A   Drain Removed []  Yes [x]  No []  N/A   Dressing Checked  Dressing Changed []  Yes  []  Yes [x]  No  [x]  No []  N/A  []  N/A   Nausea/Vomiting []  Yes [x]  No    Ice Packs Changed []  Yes [x]  No []  N/A   Incentive Spirometer  []  Yes [x]  No Volume N/A   SCD Pumps On Bilaterally  Ankle Pumping  []  Yes  []  Yes [x]  Yes  [x]  No []  N/A  []  N/A    Telemetry Monitoring []  Yes [x]  No Rhythm N/A   Up to Chair for Meals [x]  Yes []  No []  N/A

## 2018-01-25 NOTE — ED Notes (Signed)
Spoke with Mr.Webb, Director of [facility] at [phone] and provided with update on patient.

## 2018-01-25 NOTE — ED Notes (Signed)
TRANSFER - OUT REPORT:    Verbal report given to Daeja, RN(name) on Ian Franklin  being transferred to 5 south(unit) for routine progression of care       Report consisted of patient???s Situation, Background, Assessment and   Recommendations(SBAR).     Information from the following report(s) SBAR, ED Summary and MAR was reviewed with the receiving nurse.    Lines:   Peripheral IV 01/25/18 Left Hand (Active)   Site Assessment Clean, dry, & intact 01/25/2018 11:08 AM   Phlebitis Assessment 0 01/25/2018 11:08 AM   Infiltration Assessment 0 01/25/2018 11:08 AM   Dressing Status Clean, dry, & intact 01/25/2018 11:08 AM   Dressing Type Transparent 01/25/2018 11:08 AM   Hub Color/Line Status Blue 01/25/2018 11:08 AM        Opportunity for questions and clarification was provided.

## 2018-01-25 NOTE — ED Notes (Signed)
Patient provided with urinal.

## 2018-01-25 NOTE — ED Triage Notes (Signed)
The patient presents for evaluation of RUQ abdominal pain that began four days ago.  States, "I was here on Saturday.  They did an ultrasound and told me I have a bunch of gall stones.  It hurts real bad."

## 2018-01-25 NOTE — H&P (Addendum)
[facility]  General Surgery    Subjective:      HPI:  Ian Franklin is a very pleasant 32 yo male with a PMHx of hep C and anxiety.  I was asked to see the patient for right upper quadrant and epigastric abdominal pain.  He has a history of gallstones.  Patient describes first experiencing pain while in the shower on Thursday of last week when he reached over his head.  He described as a sharp stabbing pain.  He has been nauseous since Thursday as well.  He presented to the emergency department on Saturday [facility].  He describes his diet has been already mostly low-fat.  He does not eat butter, cheese or fatty meats.  He uses a small amount of low-fat milk with cereal. He does not like fatty foods.  He recently was diagnosed with hepatitis C 3 weeks ago.  When he was in the emergency department on Saturday he was given an appointment or referral to the gastroenterologist and to general surgeon.  He was supposed to see a general surgeon today who he thinks is Dr. Wylene Simmer but he woke this morning with RUQ abdominal pain.  The pain is worsened by inhaling deeply.  He denies fever or chills or emesis or constipation.  He endorses nausea, bloating and diarrhea.    Patient Active Problem List    Diagnosis Date Noted   ??? Cholecystitis 01/25/2018     Past Medical History:   Diagnosis Date   ??? Hepatitis C infection       History reviewed. No pertinent surgical history.   History reviewed. No pertinent family history.   Social History     Tobacco Use   ??? Smoking status: Current Every Day Smoker     Packs/day: 0.25   ??? Smokeless tobacco: Never Used   Substance Use Topics   ??? Alcohol use: Yes      No Known Allergies    Prior to Admission medications    Medication Sig Start Date End Date Taking? Authorizing Provider   gabapentin (NEURONTIN) 300 mg capsule Take 300 mg by mouth three (3) times daily.   Yes Other, Phys, MD   OLANZapine (ZYPREXA) 10 mg tablet Take 10 mg by mouth nightly.   Yes  Other, Phys, MD   clonazePAM (KLONOPIN) 0.5 mg tablet Take 0.5 mg by mouth two (2) times a day.   Yes Other, Phys, MD   ondansetron hcl (ZOFRAN) 4 mg tablet Take 1 Tab by mouth every eight (8) hours as needed for Nausea. 01/23/18   Filbert Schilder, MD       Review of Systems:    14 systems were reviewed.  The results are as above in the HPI and otherwise negative.     Objective:     Vitals:    01/25/18 1038 01/25/18 1100   BP: 144/86    Pulse: 87    Resp: 14    Temp: 98 ??F (36.7 ??C)    SpO2: 97% 97%   Weight: 74.8 kg (165 lb)    Height: '6\' 5"'  (1.956 m)        Physical Exam:  GENERAL: alert, cooperative, no distress, appears stated age,   EYE: conjunctivae/corneas clear. PERRL, EOM's intact.  THROAT & NECK: normal and no erythema or exudates noted. ,    LYMPHATIC: Cervical, supraclavicular, and axillary nodes normal. ,   LUNG: clear to auscultation bilaterally,   HEART: regular rate and rhythm, S1, S2  normal, no murmur, click, rub or gallop,   ABDOMEN: soft, non-distended, right upper quadrant tenderness to palpation with voluntary guarding.  Bowel sounds normal. No masses,  no organomegaly,   EXTREMITIES:  extremities normal, atraumatic, no cyanosis or edema,   SKIN: Normal.,   NEUROLOGIC: AOx3. Cranial nerves 2-12 and sensation grossly intact.,     Data Review:  I reviewed the RUQ [place] from 7 and 9 Sept independently on the monitor - multiple gallstones with thickened wall.      Recent Results (from the past 24 hour(s))   CBC WITH AUTOMATED DIFF    Collection Time: 01/25/18 11:02 AM   Result Value Ref Range    WBC 10.5 4.6 - 13.2 K/uL    RBC 4.87 4.70 - 5.50 M/uL    HGB 14.2 13.0 - 16.0 g/dL    HCT 42.1 36.0 - 48.0 %    MCV 86.4 74.0 - 97.0 FL    MCH 29.2 24.0 - 34.0 PG    MCHC 33.7 31.0 - 37.0 g/dL    RDW 16.5 (H) 11.6 - 14.5 %    PLATELET 268 135 - 420 K/uL    MPV 10.4 9.2 - 11.8 FL    NEUTROPHILS 62 40 - 73 %    LYMPHOCYTES 25 21 - 52 %    MONOCYTES 8 3 - 10 %    EOSINOPHILS 4 0 - 5 %    BASOPHILS 1 0 - 2 %     ABS. NEUTROPHILS 6.5 1.8 - 8.0 K/UL    ABS. LYMPHOCYTES 2.7 0.9 - 3.6 K/UL    ABS. MONOCYTES 0.8 0.05 - 1.2 K/UL    ABS. EOSINOPHILS 0.4 0.0 - 0.4 K/UL    ABS. BASOPHILS 0.1 0.0 - 0.1 K/UL    DF AUTOMATED     METABOLIC PANEL, BASIC    Collection Time: 01/25/18 11:02 AM   Result Value Ref Range    Sodium 143 136 - 145 mmol/L    Potassium 4.1 3.5 - 5.5 mmol/L    Chloride 110 100 - 111 mmol/L    CO2 28 21 - 32 mmol/L    Anion gap 5 3.0 - 18 mmol/L    Glucose 92 74 - 99 mg/dL    BUN 10 7.0 - 18 MG/DL    Creatinine 0.94 0.6 - 1.3 MG/DL    BUN/Creatinine ratio 11 (L) 12 - 20      GFR est AA >60 >60 ml/min/1.46m    GFR est non-AA >60 >60 ml/min/1.754m   Calcium 8.9 8.5 - 10.1 MG/DL   HEPATIC FUNCTION PANEL    Collection Time: 01/25/18 11:02 AM   Result Value Ref Range    Protein, total 6.9 6.4 - 8.2 g/dL    Albumin 3.7 3.4 - 5.0 g/dL    Globulin 3.2 2.0 - 4.0 g/dL    A-G Ratio 1.2 0.8 - 1.7      Bilirubin, total 0.4 0.2 - 1.0 MG/DL    Bilirubin, direct 0.1 0.0 - 0.2 MG/DL    Alk. phosphatase 145 (H) 45 - 117 U/L    AST (SGOT) 98 (H) 10 - 38 U/L    ALT (SGPT) 281 (H) 16 - 61 U/L   LIPASE    Collection Time: 01/25/18 11:30 AM   Result Value Ref Range    Lipase 142 73 - 393 U/L   PROTHROMBIN TIME + INR    Collection Time: 01/25/18 11:30 AM   Result Value Ref Range  Prothrombin time 11.6 11.5 - 15.2 sec    INR 0.9 0.8 - 1.2     PTT    Collection Time: 01/25/18 11:30 AM   Result Value Ref Range    aPTT 26.6 23.0 - 36.4 SEC       Impression:     ?? Patient with cholelithiasis and right upper quadrant abdominal pain concerning for acute or chronic cholecystitis however the newly diagnosed hepatitis C could also be a cause for the abdominal pain right upper quadrant.    Plan:     ?? IV fluid-LR  ?? Pain control  ?? HIDA scan in the morning  ?? Clear liquid diet  ?? N.p.o. after midnight  ?? SCDs bilateral lower extremity, heparin 5000 units subcu every 8 hours    Signed By: Lia Hopping, MD     January 25, 2018

## 2018-01-25 NOTE — ED Provider Notes (Signed)
EMERGENCY DEPARTMENT HISTORY AND PHYSICAL EXAM    11:31 AM  Date: 01/25/2018  Patient Name: Ian Franklin    History of Presenting Illness     Chief Complaint   Patient presents with   ??? Abdominal Pain       History Provided By: patient    HPI: KENETH BORG is a 32 y.o. male with past medical history of anxiety presents with epigastric and right upper quadrant pain for a few days.  Patient states that has history of gallstones.  He was supposed to see surgery as outpatient today.  However he woke up with sharp right upper quadrant pain worsening on inspiration.  He states that it is hard for him to breathe.  He has nausea but no vomiting. No fever no chills.         PCP: None    Past History     Past Medical History:  Past Medical History:   Diagnosis Date   ??? Hepatitis C infection        Past Surgical History:  History reviewed. No pertinent surgical history.    Family History:  History reviewed. No pertinent family history.    Social History:  Social History     Tobacco Use   ??? Smoking status: Current Every Day Smoker     Packs/day: 0.25   ??? Smokeless tobacco: Never Used   Substance Use Topics   ??? Alcohol use: Yes   ??? Drug use: Never       Allergies:  No Known Allergies    Review of Systems   Review of Systems   Constitutional: Negative for activity change, appetite change and chills.   HENT: Negative for congestion, ear discharge, ear pain and sore throat.    Eyes: Negative for photophobia and pain.   Respiratory: Negative for cough and choking.    Cardiovascular: Negative for palpitations and leg swelling.   Gastrointestinal: Positive for abdominal pain and nausea. Negative for anal bleeding and rectal pain.   Endocrine: Negative for polydipsia and polyuria.   Genitourinary: Negative for genital sores and urgency.   Musculoskeletal: Negative for arthralgias and myalgias.   Neurological: Negative for dizziness, seizures and speech difficulty.    Psychiatric/Behavioral: Negative for hallucinations, self-injury and suicidal ideas.        Physical Exam     Patient Vitals for the past 12 hrs:   Temp Pulse Resp BP SpO2   01/25/18 2107 97.8 ??F (36.6 ??C) 64 18 105/61 96 %   01/25/18 1620 98 ??F (36.7 ??C) 63 18 120/77 99 %       Physical Exam   Constitutional: He is oriented to person, place, and time. He appears well-developed and well-nourished.   HENT:   Head: Normocephalic and atraumatic.   Eyes: Right eye exhibits no discharge. Left eye exhibits no discharge.   Neck: Normal range of motion. Neck supple.   Cardiovascular: Normal rate, regular rhythm, normal heart sounds and intact distal pulses.   No murmur heard.  Pulmonary/Chest: Effort normal and breath sounds normal. No stridor. No respiratory distress. He has no wheezes. He has no rales. He exhibits no tenderness.   Abdominal: Soft. Bowel sounds are normal. He exhibits no distension. There is tenderness. There is no rebound and no guarding.   RUQ tenderness  Positive murphy's   Musculoskeletal: Normal range of motion.   Neurological: He is alert and oriented to person, place, and time.   Skin: Skin is warm and dry.  Psychiatric: He has a normal mood and affect.   Nursing note and vitals reviewed.      Diagnostic Study Results     Labs -  No results found for this or any previous visit (from the past 12 hour(s)).    Radiologic Studies -   [employer]    Result Date: 01/25/2018  IMPRESSION: Cholelithiasis with reported positive Murphy's sign, but no wall thickening or pericholecystic fluid and no significant distention. Sonographically acute cholecystitis is felt to be unlikely. If there is high clinical suspicion, consider HIDA scan. Slightly increased hepatic echotexture suggests nonspecific hepatocellular disease such as steatosis. No other significant findings. Exam limitations described above.         Medical Decision Making     ED Course: Progress Notes, Reevaluation, and Consults:     11:31 AM Initial assessment performed. The patients presenting problems have been discussed, and they/their family are in agreement with the care plan formulated and outlined with them.  I have encouraged them to ask questions as they arise throughout their visit.        Provider Notes (Medical Decision Making): Ultrasound to rule out cholecystitis  Patient received multiple rounds of IV medication including morphine and Toradol.  However patient remains in a lot of pain.  Concern for cholecystitis. It was not diagnosed in ultrasound.  However patient may require HIDA scan. No obstructive pattern of LFTs.    Patient had sonographic Murphy's ultrasound, discussed with Dr. Mayford Knife- surgical attending and reviewed the ultrasound scan and he thinks that the gallbladder wall appears quite thickened.  He is going to admit the patient. IV antibiotics were started.    Vital Signs-Reviewed the patient's vital signs. Reviewed pt's pulse ox reading.         Records Reviewed: old medical records (Time of Review: 11:31 AM)  -I am the first provider for this patient.  -I reviewed the vital signs, available nursing notes, past medical history, past surgical history, family history and social history.    Current Facility-Administered Medications   Medication Dose Route Frequency Provider Last Rate Last Dose   ??? sodium chloride 0.9 % bolus infusion 1,000 mL  1,000 mL IntraVENous ONCE Ruthell Feigenbaum, MD       ??? sodium chloride (NS) flush 5-40 mL  5-40 mL IntraVENous Q8H Marylyn Ishihara, MD   10 mL at 01/25/18 2147   ??? sodium chloride (NS) flush 5-40 mL  5-40 mL IntraVENous PRN Marylyn Ishihara, MD       ??? lactated Ringers infusion  125 mL/hr IntraVENous CONTINUOUS Marylyn Ishihara, MD 125 mL/hr at 01/25/18 2157 125 mL/hr at 01/25/18 2157   ??? HYDROmorphone (DILAUDID) injection 1 mg  1 mg IntraVENous Q4H PRN Marylyn Ishihara, MD   1 mg at 01/25/18 1753    ??? naloxone (NARCAN) injection 0.4 mg  0.4 mg IntraVENous PRN Marylyn Ishihara, MD       ??? ondansetron [facility]) injection 4 mg  4 mg IntraVENous Q4H PRN Marylyn Ishihara, MD       ??? diphenhydrAMINE (BENADRYL) injection 12.5 mg  12.5 mg IntraVENous Q4H PRN Marylyn Ishihara, MD       ??? LORazepam (ATIVAN) injection 1 mg  1 mg IntraVENous Q8H PRN Marylyn Ishihara, MD       ??? heparin (porcine) injection 5,000 Units  5,000 Units SubCUTAneous Q8H Marylyn Ishihara, MD   Stopped at 01/25/18 2353   ??? oxyCODONE-acetaminophen (PERCOCET) 5-325 mg  per tablet 2 Tab  2 Tab Oral Q4H PRN Marylyn Ishihara, MD   2 Tab at 01/25/18 2141        Clinical Impression     Clinical Impression:   1. Cholecystitis        Disposition: admit        Malachy Mood, MD  11:31 AM

## 2018-01-25 NOTE — ED Notes (Signed)
 The patient presents for evaluation of RUQ abdominal pain that began four days ago.  States, I was here on Saturday.  They did an ultrasound and told me I have a bunch of gall stones.  It hurts real bad.

## 2018-01-25 NOTE — ED Notes (Signed)
Patient provided with urinal.

## 2018-01-25 NOTE — H&P (Signed)
[facility]  General Surgery    Subjective:      HPI:  Pt is a very pleasant 31 yo male with a PMHx of hep C and anxiety.  I was asked to see the patient for right upper quadrant and epigastric abdominal pain.  He has a history of gallstones.  Patient describes first experiencing pain while in the shower on Thursday of last week when he reached over his head.  He described as a sharp stabbing pain.  He has been nauseous since Thursday as well.  He presented to the emergency department on Saturday [facility]  He describes his diet has been already mostly low-fat.  He does not eat butter, cheese or fatty meats.  He uses a small amount of low-fat milk with cereal. He does not like fatty foods.  He recently was diagnosed with hepatitis C 3 weeks ago.  When he was in the emergency department on Saturday he was given an appointment or referral to the gastroenterologist and to general surgeon.  He was supposed to see a general surgeon today who he thinks is Dr. Wylene Simmer but he woke this morning with RUQ abdominal pain.  The pain is worsened by inhaling deeply.  He denies fever or chills or emesis or constipation.  He endorses nausea, bloating and diarrhea.    Patient Active Problem List    Diagnosis Date Noted   ??? Cholecystitis 01/25/2018     Past Medical History:   Diagnosis Date   ??? Hepatitis C infection       History reviewed. No pertinent surgical history.   History reviewed. No pertinent family history.   Social History     Tobacco Use   ??? Smoking status: Current Every Day Smoker     Packs/day: 0.25   ??? Smokeless tobacco: Never Used   Substance Use Topics   ??? Alcohol use: Yes      No Known Allergies    Prior to Admission medications    Medication Sig Start Date End Date Taking? Authorizing Provider   gabapentin (NEURONTIN) 300 mg capsule Take 300 mg by mouth three (3) times daily.   Yes Other, Phys, MD   OLANZapine (ZYPREXA) 10 mg tablet Take 10 mg by mouth nightly.   Yes Other, Phys,  MD   clonazePAM (KLONOPIN) 0.5 mg tablet Take 0.5 mg by mouth two (2) times a day.   Yes Other, Phys, MD   ondansetron hcl (ZOFRAN) 4 mg tablet Take 1 Tab by mouth every eight (8) hours as needed for Nausea. 01/23/18   Filbert Schilder, MD       Review of Systems:    14 systems were reviewed.  The results are as above in the HPI and otherwise negative.     Objective:     Vitals:    01/25/18 1038 01/25/18 1100   BP: 144/86    Pulse: 87    Resp: 14    Temp: 98 ??F (36.7 ??C)    SpO2: 97% 97%   Weight: 74.8 kg (165 lb)    Height: '6\' 5"'  (1.956 m)        Physical Exam:  GENERAL: alert, cooperative, no distress, appears stated age,   EYE: conjunctivae/corneas clear. PERRL, EOM's intact.  THROAT & NECK: normal and no erythema or exudates noted. ,    LYMPHATIC: Cervical, supraclavicular, and axillary nodes normal. ,   LUNG: clear to auscultation bilaterally,   HEART: regular rate and rhythm, S1, S2  normal, no murmur, click, rub or gallop,   ABDOMEN: soft, non-distended, right upper quadrant tenderness to palpation with voluntary guarding.  Bowel sounds normal. No masses,  no organomegaly,   EXTREMITIES:  extremities normal, atraumatic, no cyanosis or edema,   SKIN: Normal.,   NEUROLOGIC: AOx3. Cranial nerves 2-12 and sensation grossly intact.,     Data Review:  I reviewed the RUQ [place] from 7 and 9 Sept independently on the monitor - multiple gallstones with thickened wall.      Recent Results (from the past 24 hour(s))   CBC WITH AUTOMATED DIFF    Collection Time: 01/25/18 11:02 AM   Result Value Ref Range    WBC 10.5 4.6 - 13.2 K/uL    RBC 4.87 4.70 - 5.50 M/uL    HGB 14.2 13.0 - 16.0 g/dL    HCT 42.1 36.0 - 48.0 %    MCV 86.4 74.0 - 97.0 FL    MCH 29.2 24.0 - 34.0 PG    MCHC 33.7 31.0 - 37.0 g/dL    RDW 16.5 (H) 11.6 - 14.5 %    PLATELET 268 135 - 420 K/uL    MPV 10.4 9.2 - 11.8 FL    NEUTROPHILS 62 40 - 73 %    LYMPHOCYTES 25 21 - 52 %    MONOCYTES 8 3 - 10 %    EOSINOPHILS 4 0 - 5 %    BASOPHILS 1 0 - 2 %    ABS. NEUTROPHILS  6.5 1.8 - 8.0 K/UL    ABS. LYMPHOCYTES 2.7 0.9 - 3.6 K/UL    ABS. MONOCYTES 0.8 0.05 - 1.2 K/UL    ABS. EOSINOPHILS 0.4 0.0 - 0.4 K/UL    ABS. BASOPHILS 0.1 0.0 - 0.1 K/UL    DF AUTOMATED     METABOLIC PANEL, BASIC    Collection Time: 01/25/18 11:02 AM   Result Value Ref Range    Sodium 143 136 - 145 mmol/L    Potassium 4.1 3.5 - 5.5 mmol/L    Chloride 110 100 - 111 mmol/L    CO2 28 21 - 32 mmol/L    Anion gap 5 3.0 - 18 mmol/L    Glucose 92 74 - 99 mg/dL    BUN 10 7.0 - 18 MG/DL    Creatinine 0.94 0.6 - 1.3 MG/DL    BUN/Creatinine ratio 11 (L) 12 - 20      GFR est AA >60 >60 ml/min/1.85m    GFR est non-AA >60 >60 ml/min/1.711m   Calcium 8.9 8.5 - 10.1 MG/DL   HEPATIC FUNCTION PANEL    Collection Time: 01/25/18 11:02 AM   Result Value Ref Range    Protein, total 6.9 6.4 - 8.2 g/dL    Albumin 3.7 3.4 - 5.0 g/dL    Globulin 3.2 2.0 - 4.0 g/dL    A-G Ratio 1.2 0.8 - 1.7      Bilirubin, total 0.4 0.2 - 1.0 MG/DL    Bilirubin, direct 0.1 0.0 - 0.2 MG/DL    Alk. phosphatase 145 (H) 45 - 117 U/L    AST (SGOT) 98 (H) 10 - 38 U/L    ALT (SGPT) 281 (H) 16 - 61 U/L   LIPASE    Collection Time: 01/25/18 11:30 AM   Result Value Ref Range    Lipase 142 73 - 393 U/L   PROTHROMBIN TIME + INR    Collection Time: 01/25/18 11:30 AM   Result Value Ref Range  Prothrombin time 11.6 11.5 - 15.2 sec    INR 0.9 0.8 - 1.2     PTT    Collection Time: 01/25/18 11:30 AM   Result Value Ref Range    aPTT 26.6 23.0 - 36.4 SEC       Impression:     ?? Patient with cholelithiasis and right upper quadrant abdominal pain concerning for acute or chronic cholecystitis however the newly diagnosed hepatitis C could also be a cause for the abdominal pain right upper quadrant.    Plan:     ?? IV fluid-LR  ?? Pain control  ?? HIDA scan in the morning  ?? Clear liquid diet  ?? N.p.o. after midnight  ?? SCDs bilateral lower extremity, heparin 5000 units subcu every 8 hours    Signed By: Lia Hopping, MD     January 25, 2018

## 2018-01-25 NOTE — ED Provider Notes (Signed)
EMERGENCY DEPARTMENT HISTORY AND PHYSICAL EXAM    11:31 AM  Date: 01/25/2018  Patient Name: Ian Franklin    History of Presenting Illness     Chief Complaint   Patient presents with   ??? Abdominal Pain       History Provided By: patient    HPI: Ian Franklin is a 32 y.o. male with past medical history of anxiety presents with epigastric and right upper quadrant pain for a few days.  Patient states that has history of gallstones.  He was supposed to see surgery as outpatient today.  However he woke up with sharp right upper quadrant pain worsening on inspiration.  He states that it is hard for him to breathe.  He has nausea but no vomiting. No fever no chills.         PCP: None    Past History     Past Medical History:  Past Medical History:   Diagnosis Date   ??? Hepatitis C infection        Past Surgical History:  History reviewed. No pertinent surgical history.    Family History:  History reviewed. No pertinent family history.    Social History:  Social History     Tobacco Use   ??? Smoking status: Current Every Day Smoker     Packs/day: 0.25   ??? Smokeless tobacco: Never Used   Substance Use Topics   ??? Alcohol use: Yes   ??? Drug use: Never       Allergies:  No Known Allergies    Review of Systems   Review of Systems   Constitutional: Negative for activity change, appetite change and chills.   HENT: Negative for congestion, ear discharge, ear pain and sore throat.    Eyes: Negative for photophobia and pain.   Respiratory: Negative for cough and choking.    Cardiovascular: Negative for palpitations and leg swelling.   Gastrointestinal: Positive for abdominal pain and nausea. Negative for anal bleeding and rectal pain.   Endocrine: Negative for polydipsia and polyuria.   Genitourinary: Negative for genital sores and urgency.   Musculoskeletal: Negative for arthralgias and myalgias.   Neurological: Negative for dizziness, seizures and speech difficulty.   Psychiatric/Behavioral: Negative for hallucinations,  self-injury and suicidal ideas.        Physical Exam     Patient Vitals for the past 12 hrs:   Temp Pulse Resp BP SpO2   01/25/18 2107 97.8 ??F (36.6 ??C) 64 18 105/61 96 %   01/25/18 1620 98 ??F (36.7 ??C) 63 18 120/77 99 %       Physical Exam   Constitutional: He is oriented to person, place, and time. He appears well-developed and well-nourished.   HENT:   Head: Normocephalic and atraumatic.   Eyes: Right eye exhibits no discharge. Left eye exhibits no discharge.   Neck: Normal range of motion. Neck supple.   Cardiovascular: Normal rate, regular rhythm, normal heart sounds and intact distal pulses.   No murmur heard.  Pulmonary/Chest: Effort normal and breath sounds normal. No stridor. No respiratory distress. He has no wheezes. He has no rales. He exhibits no tenderness.   Abdominal: Soft. Bowel sounds are normal. He exhibits no distension. There is tenderness. There is no rebound and no guarding.   RUQ tenderness  Positive murphy's   Musculoskeletal: Normal range of motion.   Neurological: He is alert and oriented to person, place, and time.   Skin: Skin is warm and dry.  Psychiatric: He has a normal mood and affect.   Nursing note and vitals reviewed.      Diagnostic Study Results     Labs -  No results found for this or any previous visit (from the past 12 hour(s)).    Radiologic Studies -   [employer] [employer]    Result Date: 01/25/2018  IMPRESSION: Cholelithiasis with reported positive Murphy's sign, but no wall thickening or pericholecystic fluid and no significant distention. Sonographically acute cholecystitis is felt to be unlikely. If there is high clinical suspicion, consider HIDA scan. Slightly increased hepatic echotexture suggests nonspecific hepatocellular disease such as steatosis. No other significant findings. Exam limitations described above.         Medical Decision Making     ED Course: Progress Notes, Reevaluation, and Consults:    11:31 AM Initial assessment performed. The patients presenting problems  have been discussed, and they/their family are in agreement with the care plan formulated and outlined with them.  I have encouraged them to ask questions as they arise throughout their visit.        Provider Notes (Medical Decision Making): Ultrasound to rule out cholecystitis  Patient received multiple rounds of IV medication including morphine and Toradol.  However patient remains in a lot of pain.  Concern for cholecystitis. It was not diagnosed in ultrasound.  However patient may require HIDA scan. No obstructive pattern of LFTs.    Patient had sonographic Murphy's ultrasound, discussed with Dr. Mayford Knife- surgical attending and reviewed the ultrasound scan and he thinks that the gallbladder wall appears quite thickened.  He is going to admit the patient. IV antibiotics were started.    Vital Signs-Reviewed the patient's vital signs. Reviewed pt's pulse ox reading.         Records Reviewed: old medical records (Time of Review: 11:31 AM)  -I am the first provider for this patient.  -I reviewed the vital signs, available nursing notes, past medical history, past surgical history, family history and social history.    Current Facility-Administered Medications   Medication Dose Route Frequency Provider Last Rate Last Dose   ??? sodium chloride 0.9 % bolus infusion 1,000 mL  1,000 mL IntraVENous ONCE Ysenia Filice, MD       ??? sodium chloride (NS) flush 5-40 mL  5-40 mL IntraVENous Q8H Marylyn Ishihara, MD   10 mL at 01/25/18 2147   ??? sodium chloride (NS) flush 5-40 mL  5-40 mL IntraVENous PRN Marylyn Ishihara, MD       ??? lactated Ringers infusion  125 mL/hr IntraVENous CONTINUOUS Marylyn Ishihara, MD 125 mL/hr at 01/25/18 2157 125 mL/hr at 01/25/18 2157   ??? HYDROmorphone (DILAUDID) injection 1 mg  1 mg IntraVENous Q4H PRN Marylyn Ishihara, MD   1 mg at 01/25/18 1753   ??? naloxone (NARCAN) injection 0.4 mg  0.4 mg IntraVENous PRN Marylyn Ishihara, MD       ??? ondansetron [facility]) injection 4 mg  4 mg  IntraVENous Q4H PRN Marylyn Ishihara, MD       ??? diphenhydrAMINE (BENADRYL) injection 12.5 mg  12.5 mg IntraVENous Q4H PRN Marylyn Ishihara, MD       ??? LORazepam (ATIVAN) injection 1 mg  1 mg IntraVENous Q8H PRN Marylyn Ishihara, MD       ??? heparin (porcine) injection 5,000 Units  5,000 Units SubCUTAneous Q8H Marylyn Ishihara, MD   Stopped at 01/25/18 2353   ??? oxyCODONE-acetaminophen (PERCOCET) 5-325 mg  per tablet 2 Tab  2 Tab Oral Q4H PRN Marylyn Ishihara, MD   2 Tab at 01/25/18 2141        Clinical Impression     Clinical Impression:   1. Cholecystitis        Disposition: admit        Malachy Mood, MD  11:31 AM

## 2018-01-25 NOTE — ED Notes (Signed)
Spoke with Mr.Webb, Administrator, Civil Service at [phone] and provided with update on patient.

## 2018-01-25 NOTE — ED Notes (Signed)
 TRANSFER - OUT REPORT:    Verbal report given to Daeja, RN(name) on Ian Franklin  being transferred to 5 south(unit) for routine progression of care       Report consisted of patient's Situation, Background, Assessment and   Recommendations(SBAR).     Information from the following report(s) SBAR, ED Summary and MAR was reviewed with the receiving nurse.    Lines:   Peripheral IV 01/25/18 Left Hand (Active)   Site Assessment Clean, dry, & intact 01/25/2018 11:08 AM   Phlebitis Assessment 0 01/25/2018 11:08 AM   Infiltration Assessment 0 01/25/2018 11:08 AM   Dressing Status Clean, dry, & intact 01/25/2018 11:08 AM   Dressing Type Transparent 01/25/2018 11:08 AM   Hub Color/Line Status Blue 01/25/2018 11:08 AM        Opportunity for questions and clarification was provided.

## 2018-01-26 ENCOUNTER — Inpatient Hospital Stay: Admit: 2018-01-26 | Payer: MEDICAID | Attending: Surgery

## 2018-01-26 MED ORDER — MORPHINE 2 MG/ML INJECTION
2 mg/mL | Freq: Once | INTRAMUSCULAR | Status: AC
Start: 2018-01-26 — End: 2018-01-26
  Administered 2018-01-26: 20:00:00 via INTRAVENOUS

## 2018-01-26 MED ORDER — TECHNETIUM TC 99M MEBROFENIN
Freq: Once | Status: AC
Start: 2018-01-26 — End: 2018-01-26
  Administered 2018-01-26: 19:00:00 via INTRAVENOUS

## 2018-01-26 MED FILL — MORPHINE 2 MG/ML INJECTION: 2 mg/mL | INTRAMUSCULAR | Qty: 1

## 2018-01-26 MED FILL — OXYCODONE-ACETAMINOPHEN 5 MG-325 MG TAB: 5-325 mg | ORAL | Qty: 2

## 2018-01-26 MED FILL — HYDROMORPHONE 1 MG/ML INJECTION SOLUTION: 1 mg/mL | INTRAMUSCULAR | Qty: 1

## 2018-01-26 MED FILL — HEPARIN (PORCINE) 5,000 UNIT/ML IJ SOLN: 5000 unit/mL | INTRAMUSCULAR | Qty: 1

## 2018-01-26 NOTE — Other (Incomplete)
Spoke with Mrs.Edwards at [employer] will fax requested info in am.

## 2018-01-26 NOTE — Progress Notes (Signed)
Chaplain conducted an initial consultation and Spiritual Assessment for Ian Franklin, who is a 32 y.o.,male. Patient's Primary Language is: English.   According to the patient's EMR Religious Affiliation is: No religion.     The reason the Patient came to the hospital is:   Patient Active Problem List    Diagnosis Date Noted   ??? Cholecystitis 01/25/2018   ??? Acute cholecystitis 01/25/2018        The Chaplain provided the following Interventions:  Initiated a relationship of care and support.   Explored issues of faith, belief, spirituality and religious/ritual needs while hospitalized.  Listened empathically.  Provided chaplaincy education.  Provided information about Spiritual Care Services.  Offered prayer and assurance of continued prayers on patient's behalf.   Chart reviewed.    The following outcomes where achieved:  Patient shared limited information about both their medical narrative and spiritual journey/beliefs.  Chaplain confirmed Patient's Religious Affiliation.  Patient processed feeling about current hospitalization.  Patient expressed gratitude for chaplain's visit.    Assessment:  Patient does not have any religious/cultural needs that will affect patient's preferences in health care.  There are no spiritual or religious issues which require intervention at this time.     Plan:  Chaplains will continue to follow and will provide pastoral care on an as needed/requested basis.  Chaplain recommends bedside caregivers page chaplain on duty if patient shows signs of acute spiritual or emotional distress.    Chaplain Jeffrey Madison  Spiritual Care   [phone]

## 2018-01-26 NOTE — Other (Signed)
Patient returned to the floor from Nuc. Med.

## 2018-01-26 NOTE — Progress Notes (Signed)
Patient staying at [facility] for drug treatment. [facility] will provide transportation, pt sees on site doctor there, (cannot remember name).    Reason for Admission:  Cholecystitis [K81.9]  Acute cholecystitis [K81.0]                 RRAT Score:    4            Plan for utilizing home health:    Not at this time, will continue to monitor                      Likelihood of Readmission:   LOW                         Transition of Care Plan:              Initial assessment completed with patient. Cognitive status of patient: oriented to time, place, person and situation.     Face sheet information confirmed:  yes.  The patient designates mother, and [facility] employees to participate in his discharge plan and to receive any needed information. This patient curently staying at [facility].  Patient is able to navigate steps as needed.  Prior to hospitalization, patient was considered to be independent with ADLs/IADLS : yes .    Patient has a current ACP document on file: no  The [facility] will be available to transport patient home upon discharge.   The patient uses no medical equipment available in the home.     Patient is not currently active with home health.  Patient has not stayed in a skilled nursing facility or rehab.      This patient is on dialysis :no    Currently, the discharge plan is BACK TO [facility].    The patient states that he can obtain his medications from the pharmacy, and take his medications as directed.    Patient's current insurance is [employer]                Kaylee Watson, MSW  Case Management  [phone]

## 2018-01-26 NOTE — Progress Notes (Signed)
Pt was given pain meds today at two different intervals after the nurse was alerted about with holding pain meds.

## 2018-01-26 NOTE — Progress Notes (Signed)
Please do not give any pain meds and keep patient NPO until Nuc Med HIDA scan is performed.

## 2018-01-26 NOTE — Consults (Signed)
[email or web address]  [phone]    GASTROENTEROLOGY CONSULT      Impression:   1. Cholelithiasis - noted on [place], waiting on HIDA  2. RUQ pain/nausea - likely due to #1  3. Abnormal LFTs  4. HCV - treatment naive  5. Anxiety    [place] 01/25/2018  IMPRESSION:  ??  Cholelithiasis with reported positive Murphy's sign, but no wall thickening or  pericholecystic fluid and no significant distention. Sonographically acute  cholecystitis is felt to be unlikely. If there is high clinical suspicion,  consider HIDA scan.  ??  Slightly increased hepatic echotexture suggests nonspecific hepatocellular  disease such as steatosis.  ??  No other significant findings. Exam limitations described above.      Plan:     1. Await HIDA  2. Await HCV PCR  3. Monitor LFTs, appear to be trending downward  4. Appreciate input from surgery  5. Continue current medical management per primary team      Chief Complaint: RUQ pain      HPI:  Ian Franklin is a 32 y.o. male who I am being asked to see in consultation for an opinion regarding the above. He presented to the ED twice in past few days with continued RUQ pain and nausea. [place] X 2 shows cholelithiasis as noted above. He complains of associated nausea and pain can radiate up into his shoulder. He denies reflux, change in bowel habits or blood in the stool.    Hx HCV but has not yet been treated    PMH:   Past Medical History:   Diagnosis Date   ??? Hepatitis C infection        PSH:   History reviewed. No pertinent surgical history.    Social HX:   Social History     Socioeconomic History   ??? Marital status: SINGLE     Spouse name: Not on file   ??? Number of children: Not on file   ??? Years of education: Not on file   ??? Highest education level: Not on file   Occupational History   ??? Not on file   Social Needs   ??? Financial resource strain: Not on file   ??? Food insecurity:     Worry: Not on file     Inability: Not on file   ??? Transportation needs:     Medical: Not on file     Non-medical: Not on file    Tobacco Use   ??? Smoking status: Current Every Day Smoker     Packs/day: 0.25   ??? Smokeless tobacco: Never Used   Substance and Sexual Activity   ??? Alcohol use: Yes   ??? Drug use: Never   ??? Sexual activity: Not on file   Lifestyle   ??? Physical activity:     Days per week: Not on file     Minutes per session: Not on file   ??? Stress: Not on file   Relationships   ??? Social connections:     Talks on phone: Not on file     Gets together: Not on file     Attends religious service: Not on file     Active member of club or organization: Not on file     Attends meetings of clubs or organizations: Not on file     Relationship status: Not on file   ??? Intimate partner violence:     Fear of current or ex partner: Not on file  Emotionally abused: Not on file     Physically abused: Not on file     Forced sexual activity: Not on file   Other Topics Concern   ??? Not on file   Social History Narrative   ??? Not on file       FHX:   History reviewed. No pertinent family history.    Allergy:   No Known Allergies    Patient Active Problem List   Diagnosis Code   ??? Cholecystitis K81.9   ??? Acute cholecystitis K81.0       Home Medications:     Medications Prior to Admission   Medication Sig   ??? gabapentin (NEURONTIN) 300 mg capsule Take 300 mg by mouth three (3) times daily.   ??? OLANZapine (ZYPREXA) 10 mg tablet Take 10 mg by mouth nightly.   ??? clonazePAM (KLONOPIN) 0.5 mg tablet Take 0.5 mg by mouth two (2) times a day.   ??? ondansetron hcl (ZOFRAN) 4 mg tablet Take 1 Tab by mouth every eight (8) hours as needed for Nausea.       Review of Systems:     Constitutional: No fevers, chills, weight loss, fatigue.   Skin: No rashes, pruritis, jaundice, ulcerations, erythema.   HENT: No headaches, nosebleeds, sinus pressure, rhinorrhea, sore throat.   Eyes: No visual changes, blurred vision, eye pain, photophobia, jaundice.   Cardiovascular: No chest pain, heart palpitations.   Respiratory: No cough, SOB, wheezing, chest discomfort, orthopnea.    Gastrointestinal:    Genitourinary: No dysuria, bleeding, discharge, pyuria.   Musculoskeletal: No weakness, arthralgias, wasting.   Endo: No sweats.   Heme: No bruising, easy bleeding.   Allergies: As noted.   Neurological: Cranial nerves intact.  Alert and oriented. Gait not assessed.   Psychiatric:  No anxiety, depression, hallucinations.          Visit Vitals  BP 109/59 (BP 1 Location: Left arm, BP Patient Position: At rest)   Pulse 69   Temp 97.1 ??F (36.2 ??C)   Resp 16   Ht 6\' 5"  (1.956 m)   Wt 74.8 kg (165 lb)   SpO2 98%   BMI 19.57 kg/m??       Physical Assessment:     constitutional: appearance: thin, in no acute distress.   skin: inspection: no rashes, ulcers, icterus or other lesions; no clubbing or telangiectasias. palpation: no induration or subcutaneos nodules.   eyes: inspection: normal conjunctivae and lids; no jaundice pupils: normal  ENMT: mouth: normal oral mucosa,lips and gums; good dentition. oropharynx: normal tongue, hard and soft palate; posterior pharynx without erithema, exudate or lesions.   neck: thyroid: normal size, consistency and position; no masses or tenderness.   respiratory: effort: normal chest excursion; no intercostal retraction or accessory muscle use.   cardiovascular: abdominal aorta: normal size and position; no bruits. palpation: PMI of normal size and position; normal rhythm; no thrill or murmurs.   abdominal: abdomen: normal consistency; moderate RUQ tenderness no masses. hernias: no hernias appreciated. liver: normal size and consistency. spleen: not palpable.   rectal: hemoccult/guaiac: not performed.   musculoskeletal: digits and nails: no clubbing, cyanosis, petechiae or other inflammatory conditions. gait: not observed, resting in bed head and neck: normal range of motion; no pain, crepitation or contracture. spine/ribs/pelvis: normal range of motion; no pain, deformity or contracture.   neurologic: cranial nerves: II-XII grossly normal.    psychiatric: judgement/insight: within normal limits. memory: within normal limits for recent and remote events. mood and affect: no  evidence of depression, anxiety or agitation. orientation: oriented to time, space and person.        Basic Metabolic Profile   Recent Labs     01/25/18  1102   NA 143   K 4.1   CL 110   CO2 28   BUN 10   GLU 92   CA 8.9         CBC w/Diff    Recent Labs     01/25/18  1102   WBC 10.5   RBC 4.87   HGB 14.2   HCT 42.1   MCV 86.4   MCH 29.2   MCHC 33.7   RDW 16.5*   PLT 268    Recent Labs     01/25/18  1102   GRANS 62   LYMPH 25   EOS 4        Hepatic Function   Recent Labs     01/25/18  1130 01/25/18  1102   ALB  --  3.7   TP  --  6.9   TBILI  --  0.4   SGOT  --  98*   AP  --  145*   LPSE 142  --         Coags   Recent Labs     01/25/18  1130   PTP 11.6   INR 0.9   APTT 26.6           Jodell Weitman A Schrider, PA.   [facility] [place], [place]  [phone]  Cell: [phone]  [email or web address]

## 2018-01-26 NOTE — Progress Notes (Signed)
Problem: General Medical Care Plan  Goal: *Vital signs within specified parameters  Outcome: Progressing Towards Goal  Goal: *Labs within defined limits  Outcome: Progressing Towards Goal  Goal: *Absence of infection signs and symptoms  Outcome: Progressing Towards Goal  Goal: *Optimal pain control at patient's stated goal  Outcome: Progressing Towards Goal  Goal: *Skin integrity maintained  Outcome: Progressing Towards Goal  Goal: *Fluid volume balance  Outcome: Progressing Towards Goal  Goal: *Optimize nutritional status  Outcome: Progressing Towards Goal  Goal: *Anxiety reduced or absent  Outcome: Progressing Towards Goal  Goal: *Progressive mobility and function (eg: ADL's)  Outcome: Progressing Towards Goal

## 2018-01-26 NOTE — Progress Notes (Signed)
Maeci Kalbfleisch P. Mayford Knife, [name]  PROGRESS NOTE    Name: Ian Franklin MRN: [id number]   DOB: 1986-01-13 Hospital: [facility]   Date: 01/26/2018 Admission Date: 01/25/2018 10:40 AM     Hospital Day: 2     Subjective:  Patient without acute overnight events.  He did have pain which was not well controlled by the Dilaudid IV.  I changed him to Percocet p.o. with a low-fat diet and Dilaudid for breakthrough pain.  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  He is stopped using heroin 8 years ago.  He was on methadone for 4 years.  He has been off of methadone for 4 years.  He has a high tolerance to pain medication.  Objective:  Vitals:    01/25/18 2107 01/26/18 0138 01/26/18 0538 01/26/18 1109   BP: 105/61 110/67 119/74 109/59   Pulse: 64 68 83 69   Resp: 18 17 16 16    Temp: 97.8 ??F (36.6 ??C) 97.9 ??F (36.6 ??C) 97.6 ??F (36.4 ??C) 97.1 ??F (36.2 ??C)   SpO2: 96% 95% 96% 98%   Weight:       Height:         Date 01/25/18 0700 - 01/26/18 0659 01/26/18 0700 - 01/27/18 0659   Shift 0700-1859 1900-0659 24 Hour Total 0700-1859 1900-0659 24 Hour Total   INTAKE   I.V.(mL/kg/hr) 1000(1.1)  1000(0.6)        Volume (sodium chloride 0.9 % bolus infusion 1,000 mL) 1000  1000      Shift Total(mL/kg) 1000(13.4)  1000(13.4)      OUTPUT   Urine(mL/kg/hr)  750(0.8) 750(0.4)        Urine Voided  750 750      Shift Total(mL/kg)  750(10) 750(10)      NET 1000 -750 250      Weight (kg) 74.8 74.8 74.8 74.8 74.8 74.8         Physical Exam:    General: Awake and alert, oriented x4, no apparent distress   Abdomen: abdomen is soft with right upper quadrant tenderness.  No masses, organomegaly or guarding    Labs:  No results found for this or any previous visit (from the past 24 hour(s)).  All Micro Results     None          Current Medications:  Current Facility-Administered Medications   Medication Dose Route Frequency Provider Last Rate Last Dose    ??? sodium chloride (NS) flush 5-40 mL  5-40 mL IntraVENous Q8H Marylyn Ishihara, MD   10 mL at 01/26/18 0654   ??? sodium chloride (NS) flush 5-40 mL  5-40 mL IntraVENous PRN Marylyn Ishihara, MD       ??? lactated Ringers infusion  125 mL/hr IntraVENous CONTINUOUS Marylyn Ishihara, MD 125 mL/hr at 01/26/18 0604 125 mL/hr at 01/26/18 0604   ??? HYDROmorphone (DILAUDID) injection 1 mg  1 mg IntraVENous Q4H PRN Marylyn Ishihara, MD   1 mg at 01/26/18 4098   ??? naloxone (NARCAN) injection 0.4 mg  0.4 mg IntraVENous PRN Marylyn Ishihara, MD       ??? ondansetron [facility]) injection 4 mg  4 mg IntraVENous Q4H PRN Marylyn Ishihara, MD       ??? diphenhydrAMINE (BENADRYL) injection 12.5 mg  12.5 mg IntraVENous Q4H PRN Marylyn Ishihara, MD       ??? LORazepam (ATIVAN) injection 1 mg  1 mg IntraVENous Q8H PRN Marylyn Ishihara, MD       ??? heparin (porcine) injection 5,000 Units  5,000 Units SubCUTAneous Q8H Marylyn Ishihara, MD   Stopped at 01/25/18 2353   ??? oxyCODONE-acetaminophen (PERCOCET) 5-325 mg per tablet 2 Tab  2 Tab Oral Q4H PRN Marylyn Ishihara, MD   2 Tab at 01/26/18 7628       Chart and notes reviewed. Data reviewed. I have evaluated and examined the patient.        IMPRESSION:   ?? Patient with hepatitis C and gallstones on CAT scan and ultrasound without evidence of cholecystitis.      PLAN:/DISCUSION:   ?? Nuclear medicine hepatobiliary scan without intervention today  ?? GI-Dr. Kalman Shan to see regarding hepatitis C  ?? Continue present care  ?? Patient may eat low-fat diet after the HIDA scan        Marylyn Ishihara, MD

## 2018-01-26 NOTE — Other (Signed)
Patient to Nuc. Med. No distress noted.

## 2018-01-26 NOTE — Other (Incomplete)
Bedside and Verbal shift change report given to Edna,RN (oncoming nurse) by Tracy,RN (offgoing nurse). Report included the following information SBAR, Kardex, Intake/Output, MAR and Recent Results.

## 2018-01-26 NOTE — Progress Notes (Signed)
Problem: Patient Education: Go to Patient Education Activity  Goal: Patient/Family Education  Outcome: Progressing Towards Goal     Problem: Pain  Goal: *Control of Pain  Outcome: Progressing Towards Goal     Problem: Patient Education: Go to Patient Education Activity  Goal: Patient/Family Education  Outcome: Progressing Towards Goal     Problem: Falls - Risk of  Goal: *Absence of Falls  Description  Document Schmid Fall Risk and appropriate interventions in the flowsheet.  Outcome: Progressing Towards Goal  Note:   Fall Risk Interventions:            Medication Interventions: Patient to call before getting OOB, Teach patient to arise slowly                   Problem: Patient Education: Go to Patient Education Activity  Goal: Patient/Family Education  Outcome: Progressing Towards Goal

## 2018-01-26 NOTE — Progress Notes (Signed)
Problem: Patient Education: Go to Patient Education Activity  Goal: Patient/Family Education  Outcome: Progressing Towards Goal     Problem: Pain  Goal: *Control of Pain  Outcome: Progressing Towards Goal     Problem: Patient Education: Go to Patient Education Activity  Goal: Patient/Family Education  Outcome: Progressing Towards Goal     Problem: Falls - Risk of  Goal: *Absence of Falls  Description  Document Bridgette Habermann Fall Risk and appropriate interventions in the flowsheet.  Outcome: Progressing Towards Goal  Note:   Fall Risk Interventions:            Medication Interventions: Patient to call before getting OOB, Teach patient to arise slowly                   Problem: Patient Education: Go to Patient Education Activity  Goal: Patient/Family Education  Outcome: Progressing Towards Goal

## 2018-01-26 NOTE — Progress Notes (Signed)
Patient staying at [facility] for drug treatment. [facility] will provide transportation, pt sees on site doctor there, (cannot remember name).    Reason for Admission:  Cholecystitis [K81.9]  Acute cholecystitis [K81.0]                 RRAT Score:    4            Plan for utilizing home health:    Not at this time, will continue to monitor                      Likelihood of Readmission:   LOW                         Transition of Care Plan:              Initial assessment completed with patient. Cognitive status of patient: oriented to time, place, person and situation.     Face sheet information confirmed:  yes.  The patient designates mother, and [facility] employees to participate in his discharge plan and to receive any needed information. This patient curently staying at [facility].  Patient is able to navigate steps as needed.  Prior to hospitalization, patient was considered to be independent with ADLs/IADLS : yes .    Patient has a current ACP document on file: no  The [facility] ST[facility]FF will be available to transport patient home upon discharge.   The patient uses no medical equipment available in the home.     Patient is not currently active with home health.  Patient has not stayed in a skilled nursing facility or rehab.      This patient is on dialysis :no    Currently, the discharge plan is BACK TO [facility].    The patient states that he can obtain his medications from the pharmacy, and take his medications as directed.    Patient's current insurance is [facility]                Ian Franklin, MSW  Case Management  [phone]

## 2018-01-26 NOTE — Progress Notes (Signed)
Please do not give any pain meds and keep patient NPO until Nuc Med HIDA scan is performed.

## 2018-01-26 NOTE — Consults (Signed)
Consults  by Gustavus Messing, PA at 01/26/18 1305                Author: Marjory Sneddon, Ginette Pitman, PA  Service: Gastroenterology  Author Type: Physician Assistant       Filed: 01/26/18 1326  Date of Service: 01/26/18 1305  Status: Attested           Editor: Schrider, Ginette Pitman, PA (Physician Assistant)  Cosigner: Jacques Navy, MD at 01/26/18 1337            Consult Orders        1. IP CONSULT TO GASTROENTEROLOGY [[id number]] ordered by Marylyn Ishihara, MD at 01/25/18 1650                         Attestation signed by Jacques Navy, MD at 01/26/18 1337          Pt seen and examined   Imp:  acute cholecystitis   Abnormal LFTs, ? Due to hep C . No IV drugs for >8 yr, denies ETOH   Rec:  proceed w/ lap chole   Will plan to followup as op for Hep C                                       [email or web address]   [phone]      GASTROENTEROLOGY CONSULT           Impression:     1. Cholelithiasis - noted on [place], waiting on HIDA   2. RUQ pain/nausea - likely due to #1   3. Abnormal LFTs   4. HCV - treatment naive   5. Anxiety      [place] 01/25/2018   IMPRESSION:   ??   Cholelithiasis with reported positive Murphy's sign, but no wall thickening or   pericholecystic fluid and no significant distention. Sonographically acute   cholecystitis is felt to be unlikely. If there is high clinical suspicion,   consider HIDA scan.   ??   Slightly increased hepatic echotexture suggests nonspecific hepatocellular   disease such as steatosis.   ??   No other significant findings. Exam limitations described above.           Plan:        1. Await HIDA   2. Await HCV PCR   3. Monitor LFTs, appear to be trending downward   4. Appreciate input from surgery   5. Continue current medical management per primary team         Chief Complaint: RUQ pain         HPI:   Ian Franklin is a 32 y.o.  male who I am being asked to see in consultation for an opinion regarding the above. He presented to the ED twice in past few days with continued RUQ  pain and nausea. [place] X 2 shows cholelithiasis as  noted above. He complains of associated nausea and pain can radiate up into his shoulder. He denies reflux, change in bowel habits or blood in the stool.      Hx HCV but has not yet been treated      PMH:      Past Medical History:        Diagnosis  Date         ?  Hepatitis C infection  PSH:    History reviewed. No pertinent surgical history.      Social HX:      Social History          Socioeconomic History         ?  Marital status:  SINGLE              Spouse name:  Not on file         ?  Number of children:  Not on file     ?  Years of education:  Not on file     ?  Highest education level:  Not on file       Occupational History        ?  Not on file       Social Needs         ?  Financial resource strain:  Not on file        ?  Food insecurity:              Worry:  Not on file         Inability:  Not on file        ?  Transportation needs:              Medical:  Not on file         Non-medical:  Not on file       Tobacco Use         ?  Smoking status:  Current Every Day Smoker              Packs/day:  0.25         ?  Smokeless tobacco:  Never Used       Substance and Sexual Activity         ?  Alcohol use:  Yes     ?  Drug use:  Never     ?  Sexual activity:  Not on file       Lifestyle        ?  Physical activity:              Days per week:  Not on file         Minutes per session:  Not on file         ?  Stress:  Not on file       Relationships        ?  Social connections:              Talks on phone:  Not on file         Gets together:  Not on file         Attends religious service:  Not on file         Active member of club or organization:  Not on file         Attends meetings of clubs or organizations:  Not on file         Relationship status:  Not on file        ?  Intimate partner violence:              Fear of current or ex partner:  Not on file         Emotionally abused:  Not on file         Physically abused:  Not on file         Forced  sexual activity:  Not on file        Other Topics  Concern        ?  Not on file       Social History Narrative        ?  Not on file           FHX:    History reviewed. No pertinent family history.      Allergy:    No Known Allergies        Patient Active Problem List        Diagnosis  Code         ?  Cholecystitis  K81.9         ?  Acute cholecystitis  K81.0             Home Medications:          Medications Prior to Admission        Medication  Sig         ?  gabapentin (NEURONTIN) 300 mg capsule  Take 300 mg by mouth three (3) times daily.     ?  OLANZapine (ZYPREXA) 10 mg tablet  Take 10 mg by mouth nightly.     ?  clonazePAM (KLONOPIN) 0.5 mg tablet  Take 0.5 mg by mouth two (2) times a day.         ?  ondansetron hcl (ZOFRAN) 4 mg tablet  Take 1 Tab by mouth every eight (8) hours as needed for Nausea.             Review of Systems:           Constitutional:  No fevers, chills, weight loss, fatigue.        Skin:  No rashes, pruritis, jaundice, ulcerations, erythema.     HENT:  No headaches, nosebleeds, sinus pressure, rhinorrhea, sore throat.     Eyes:  No visual changes, blurred vision, eye pain, photophobia, jaundice.     Cardiovascular:  No chest pain, heart palpitations.        Respiratory:  No cough, SOB, wheezing, chest discomfort, orthopnea.     Gastrointestinal:          Genitourinary:  No dysuria, bleeding, discharge, pyuria.        Musculoskeletal:  No weakness, arthralgias, wasting.     Endo:  No sweats.     Heme:  No bruising, easy bleeding.        Allergies:  As noted.        Neurological:  Cranial nerves intact.  Alert and oriented. Gait not assessed.        Psychiatric:   No anxiety, depression, hallucinations.               Visit Vitals      BP  109/59 (BP 1 Location: Left arm, BP Patient Position: At rest)     Pulse  69     Temp  97.1 ??F (36.2 ??C)     Resp  16     Ht  6\' 5"  (1.956 m)     Wt  74.8 kg (165 lb)     SpO2  98%        BMI  19.57 kg/m??             Physical Assessment:         constitutional: appearance: thin, in no acute distress.    skin: inspection: no rashes, ulcers, icterus or other lesions; no  clubbing or telangiectasias.  palpation: no induration or subcutaneos nodules.   eyes: inspection:  normal conjunctivae and lids; no jaundice pupils: normal   ENMT: mouth: normal oral mucosa,lips  and gums; good dentition. oropharynx: normal tongue, hard and soft palate; posterior pharynx without erithema, exudate or lesions.    neck: thyroid: normal size, consistency and position; no masses or tenderness.    respiratory: effort: normal chest excursion; no intercostal retraction or accessory muscle  use.   cardiovascular: abdominal aorta:  normal size and position; no bruits. palpation: PMI of normal size and position; normal rhythm; no thrill or murmurs.    abdominal: abdomen: normal consistency; moderate RUQ tenderness no masses. hernias:  no hernias appreciated. liver: normal size and consistency. spleen:  not palpable.   rectal: hemoccult/guaiac: not performed.    musculoskeletal: digits and nails: no clubbing, cyanosis, petechiae or other inflammatory conditions.  gait: not observed, resting in bed head and neck: normal range of motion; no pain, crepitation  or contracture. spine/ribs/pelvis: normal range of motion; no pain, deformity or contracture.    neurologic: cranial nerves: II-XII grossly normal.    psychiatric: judgement/insight: within normal limits. memory:  within normal limits for recent and remote events. mood and affect: no evidence of depression, anxiety or agitation.  orientation: oriented to time, space and person.            Basic Metabolic Profile     Recent Labs         01/25/18   1102      NA  143      K  4.1      CL  110      CO2  28      BUN  10      GLU  92      CA  8.9                    CBC w/Diff       Recent Labs         01/25/18   1102      WBC  10.5      RBC  4.87      HGB  14.2      HCT  42.1      MCV  86.4      MCH  29.2      MCHC  33.7      RDW  16.5*       PLT  268           Recent Labs         01/25/18   1102      GRANS  62      LYMPH  25      EOS  4                   Hepatic Function     Recent Labs         01/25/18   1130  01/25/18   1102      ALB   --   3.7      TP   --   6.9      TBILI   --   0.4      SGOT   --   98*      AP   --   145*      LPSE  142   --  Coags     Recent Labs         01/25/18   1130      PTP  11.6      INR  0.9      APTT  26.6                    Gustavus Messing, PA.    [facility] [place], [place]   [phone]   Cell: [phone]   [email or web address]

## 2018-01-26 NOTE — Progress Notes (Signed)
Novak Stgermaine P. Mayford Knife, [name]  PROGRESS NOTE    Name: Ian Franklin MRN: [id number]   DOB: [birth year] Hospital: [facility]   Date: 01/26/2018 Admission Date: 01/25/2018 10:40 AM     Hospital Day: 2     Subjective:  Patient without acute overnight events.  He did have pain which was not well controlled by the Dilaudid IV.  I changed him to Percocet p.o. with a low-fat diet and Dilaudid for breakthrough pain.  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  He is stopped using heroin 8 years ago.  He was on methadone for 4 years.  He has been off of methadone for 4 years.  He has a high tolerance to pain medication.  Objective:  Vitals:    01/25/18 2107 01/26/18 0138 01/26/18 0538 01/26/18 1109   BP: 105/61 110/67 119/74 109/59   Pulse: 64 68 83 69   Resp: 18 17 16 16    Temp: 97.8 ??F (36.6 ??C) 97.9 ??F (36.6 ??C) 97.6 ??F (36.4 ??C) 97.1 ??F (36.2 ??C)   SpO2: 96% 95% 96% 98%   Weight:       Height:         Date 01/25/18 0700 - 01/26/18 0659 01/26/18 0700 - 01/27/18 0659   Shift 0700-1859 1900-0659 24 Hour Total 0700-1859 1900-0659 24 Hour Total   INTAKE   I.V.(mL/kg/hr) 1000(1.1)  1000(0.6)        Volume (sodium chloride 0.9 % bolus infusion 1,000 mL) 1000  1000      Shift Total(mL/kg) 1000(13.4)  1000(13.4)      OUTPUT   Urine(mL/kg/hr)  750(0.8) 750(0.4)        Urine Voided  750 750      Shift Total(mL/kg)  750(10) 750(10)      NET 1000 -750 250      Weight (kg) 74.8 74.8 74.8 74.8 74.8 74.8         Physical Exam:    General: Awake and alert, oriented x4, no apparent distress   Abdomen: abdomen is soft with right upper quadrant tenderness.  No masses, organomegaly or guarding    Labs:  No results found for this or any previous visit (from the past 24 hour(s)).  All Micro Results     None          Current Medications:  Current Facility-Administered Medications   Medication Dose Route Frequency Provider Last Rate Last Dose   ??? sodium chloride (NS) flush 5-40  mL  5-40 mL IntraVENous Q8H Marylyn Ishihara, MD   10 mL at 01/26/18 0654   ??? sodium chloride (NS) flush 5-40 mL  5-40 mL IntraVENous PRN Marylyn Ishihara, MD       ??? lactated Ringers infusion  125 mL/hr IntraVENous CONTINUOUS Marylyn Ishihara, MD 125 mL/hr at 01/26/18 0604 125 mL/hr at 01/26/18 0604   ??? HYDROmorphone (DILAUDID) injection 1 mg  1 mg IntraVENous Q4H PRN Marylyn Ishihara, MD   1 mg at 01/26/18 5784   ??? naloxone (NARCAN) injection 0.4 mg  0.4 mg IntraVENous PRN Marylyn Ishihara, MD       ??? ondansetron [facility]) injection 4 mg  4 mg IntraVENous Q4H PRN Marylyn Ishihara, MD       ??? diphenhydrAMINE (BENADRYL) injection 12.5 mg  12.5 mg IntraVENous Q4H PRN Marylyn Ishihara, MD       ??? LORazepam (ATIVAN) injection 1 mg  1 mg IntraVENous Q8H PRN Marylyn Ishihara, MD       ??? heparin (porcine) injection 5,000 Units  5,000 Units SubCUTAneous Q8H Marylyn Ishihara, MD   Stopped at 01/25/18 2353   ??? oxyCODONE-acetaminophen (PERCOCET) 5-325 mg per tablet 2 Tab  2 Tab Oral Q4H PRN Marylyn Ishihara, MD   2 Tab at 01/26/18 1655       Chart and notes reviewed. Data reviewed. I have evaluated and examined the patient.        IMPRESSION:   ?? Patient with hepatitis C and gallstones on CAT scan and ultrasound without evidence of cholecystitis.      PLAN:/DISCUSION:   ?? Nuclear medicine hepatobiliary scan without intervention today  ?? GI-Dr. Kalman Shan to see regarding hepatitis C  ?? Continue present care  ?? Patient may eat low-fat diet after the HIDA scan        Marylyn Ishihara, MD

## 2018-01-26 NOTE — Progress Notes (Signed)
Chaplain conducted an initial consultation and Spiritual Assessment for Ian Franklin, who is a 32 y.o.,male. Patient's Primary Language is: [place].   According to the patient's EMR Religious Affiliation is: No religion.     The reason the Patient came to the hospital is:   Patient Active Problem List    Diagnosis Date Noted   . Cholecystitis 01/25/2018   . Acute cholecystitis 01/25/2018        The Chaplain provided the following Interventions:  Initiated a relationship of care and support.   Explored issues of faith, belief, spirituality and religious/ritual needs while hospitalized.  Listened empathically.  Provided chaplaincy education.  Provided information about Spiritual Care Services.  Offered prayer and assurance of continued prayers on patient's behalf.   Chart reviewed.    The following outcomes where achieved:  Patient shared limited information about both their medical narrative and spiritual journey/beliefs.  Chaplain confirmed Patient's Religious Affiliation.  Patient processed feeling about current hospitalization.  Patient expressed gratitude for chaplain's visit.    Assessment:  Patient does not have any religious/cultural needs that will affect patient's preferences in health care.  There are no spiritual or religious issues which require intervention at this time.     Plan:  Chaplains will continue to follow and will provide pastoral care on an as needed/requested basis.  Chaplain recommends bedside caregivers page chaplain on duty if patient shows signs of acute spiritual or emotional distress.    Chaplain [facility]  Spiritual Care   [phone]

## 2018-01-26 NOTE — Progress Notes (Signed)
Pt was given pain meds today at two different intervals after the nurse was alerted about with holding pain meds.

## 2018-01-27 MED ORDER — WATER FOR INJECTION, STERILE INJECTION
2 gram | INTRAMUSCULAR | Status: AC
Start: 2018-01-27 — End: 2018-01-27
  Administered 2018-01-27: 14:00:00 via INTRAVENOUS

## 2018-01-27 MED FILL — HYDROMORPHONE 1 MG/ML INJECTION SOLUTION: 1 mg/mL | INTRAMUSCULAR | Qty: 1

## 2018-01-27 MED FILL — OXYCODONE-ACETAMINOPHEN 5 MG-325 MG TAB: 5-325 mg | ORAL | Qty: 2

## 2018-01-27 MED FILL — CEFOTETAN 2 GRAM SOLUTION FOR INJECTION: 2 gram | INTRAMUSCULAR | Qty: 2

## 2018-01-27 MED FILL — HEPARIN (PORCINE) 5,000 UNIT/ML IJ SOLN: 5000 unit/mL | INTRAMUSCULAR | Qty: 1

## 2018-01-27 NOTE — Progress Notes (Signed)
Jayren Cease P. Mayford Knife, [name]  PROGRESS NOTE    Name: Ian Franklin MRN: [id number]   DOB: 1985/06/24 Hospital: [facility]   Date: 01/27/2018 Admission Date: 01/25/2018 10:40 AM     Hospital Day: 3     Subjective:  Patient again voicing has high tolerance to pain medicine secondary to his history of heroin abuse.  Otherwise no acute overnight events.  The HIDA scan results are noted as chronic cholecystitis with delayed filling of the gallbladder.  Objective:  Vitals:    01/26/18 1959 01/26/18 2125 01/27/18 0613 01/27/18 1047   BP: 97/52 115/72 105/53 120/68   Pulse: 78 60 76 74   Resp: 16 14 12 15    Temp: 98.2 ??F (36.8 ??C) 98.8 ??F (37.1 ??C) 97.4 ??F (36.3 ??C) 98.5 ??F (36.9 ??C)   SpO2: 98% 98% 96% 98%   Weight:       Height:         Date 01/26/18 0700 - 01/27/18 0659 01/27/18 0700 - 01/28/18 0659   Shift 0700-1859 1900-0659 24 Hour Total 0700-1859 1900-0659 24 Hour Total   INTAKE   P.O.  240 240 320  320     P.O.  240 240 320  320   I.V.(mL/kg/hr) 7408.1(4.4) 1431.3(1.6) 8185.6(3.1)        Volume (lactated Ringers infusion) 2537.5 1431.3 3968.8      Shift Total(mL/kg) 2537.5(33.9) 4970.2(63.7) 8588.8(56.2) 320(4.3)  320(4.3)   OUTPUT   Urine(mL/kg/hr)  800(0.9) 800(0.4) 550  550     Urine Voided  800 800 550  550   Shift Total(mL/kg)  800(10.7) 800(10.7) 550(7.3)  550(7.3)   NET 2537.5 871.3 3408.8 -230  -230   Weight (kg) 74.8 74.8 74.8 74.8 74.8 74.8         Physical Exam:    General: Awake and alert, oriented x4, no apparent distress   Abdomen: abdomen is soft with right upper quadrant tenderness.   No masses, organomegaly or guarding    Labs:  No results found for this or any previous visit (from the past 24 hour(s)).  All Micro Results     None          Current Medications:  Current Facility-Administered Medications   Medication Dose Route Frequency Provider Last Rate Last Dose   ??? sodium chloride (NS) flush 5-40 mL  5-40 mL IntraVENous Q8H Marylyn Ishihara, MD   10 mL at 01/26/18 0654    ??? sodium chloride (NS) flush 5-40 mL  5-40 mL IntraVENous PRN Marylyn Ishihara, MD   10 mL at 01/27/18 0542   ??? lactated Ringers infusion  125 mL/hr IntraVENous CONTINUOUS Marylyn Ishihara, MD 125 mL/hr at 01/26/18 1958 125 mL/hr at 01/26/18 1958   ??? HYDROmorphone (DILAUDID) injection 1 mg  1 mg IntraVENous Q4H PRN Marylyn Ishihara, MD   1 mg at 01/27/18 1026   ??? naloxone (NARCAN) injection 0.4 mg  0.4 mg IntraVENous PRN Marylyn Ishihara, MD       ??? ondansetron [facility]) injection 4 mg  4 mg IntraVENous Q4H PRN Marylyn Ishihara, MD       ??? diphenhydrAMINE (BENADRYL) injection 12.5 mg  12.5 mg IntraVENous Q4H PRN Marylyn Ishihara, MD       ??? LORazepam (ATIVAN) injection 1 mg  1 mg IntraVENous Q8H PRN Marylyn Ishihara, MD       ??? heparin (porcine) injection 5,000 Units  5,000 Units SubCUTAneous Q8H Marylyn Ishihara, MD   5,000  Units at 01/27/18 4403   ??? oxyCODONE-acetaminophen (PERCOCET) 5-325 mg per tablet 2 Tab  2 Tab Oral Q4H PRN Marylyn Ishihara, MD   2 Tab at 01/27/18 4742       Chart and notes reviewed. Data reviewed. I have evaluated and examined the patient.        IMPRESSION:   ?? Patient with chronic calculus cholecystitis and hepatitis C.      PLAN:/DISCUSION:   ?? Robot-assisted laparoscopic cholecystectomy with liver biopsy  ?? Consent on chart  ?? Preoperative orders written        Marylyn Ishihara, MD

## 2018-01-27 NOTE — Progress Notes (Signed)
Bedside and Verbal shift change report given to Daeja, RN (oncoming nurse) by Edna, RN (offgoing nurse). Report included the following information SBAR, Kardex, Procedure Summary, Intake/Output and MAR.

## 2018-01-27 NOTE — Other (Signed)
Bedside and Verbal shift change report given to Kathy, RN (oncoming nurse) by Daeja, RN (offgoing nurse). Report included the following information SBAR, Kardex and MAR.

## 2018-01-27 NOTE — Other (Signed)
Bedside and Verbal shift change report given to Haze Rushing, RN (oncoming nurse) by Zenaida Deed, RN (offgoing nurse). Report included the following information Kardex and MAR.     End of Shift Note     Bedside and verbal shift change report given to Zenaida Deed, RN (On coming nurse) by Haze Rushing, RN (Off going nurse).  Report included the following information:Procedure Summary, Intake/Output, MAR, Recent Results, Med Rec Status, and SBAR  (Situation, Background, Assessment and Recommendations)    SBAR Recommendations: Give PO pain meds first    Issues for Provider to address None noted        Activity This Shift      []  Resting in bed   []  TDWB   []  Dangled     []  Chair Ambulated to     [x]  Bathroom     []  BSC     []  Refused Ambulated In    []  Room    []  Hallway    []  Bedrest ordered   Bladder Scan  Voiding Status []  Yes  [x]  Void [x]  No  []  Foley []  N/A  []  I&O Cath   Blood Sugars Managed []  Yes [x]  No []  N/A   Bowel Movement  Passing Gas []  Yes  [x]  Yes [x]  No  []  No    CHG Bath Done     Before Surgery     After Surgery   [x]  Yes  []  Yes   []  No  []  No   []  N/A  []  N/A   Drain Removed []  Yes [x]  No []  N/A   Dressing Checked  Dressing Changed []  Yes  []  Yes [x]  No  [x]  No []  N/A  []  N/A   Nausea/Vomiting []  Yes [x]  No    Ice Packs Changed []  Yes [x]  No []  N/A   Incentive Spirometer  [x]  Yes []  No Volume 2500   SCD Pumps On Bilaterally  Ankle Pumping  [x]  Yes  []  Yes []  Yes  [x]  No []  N/A  []  N/A    Telemetry Monitoring []  Yes [x]  No Rhythm N/A   Up to Chair for Meals [x]  Yes []  No []  N/A

## 2018-01-27 NOTE — Progress Notes (Signed)
Problem: General Medical Care Plan  Goal: *Vital signs within specified parameters  Outcome: Progressing Towards Goal  Goal: *Labs within defined limits  Outcome: Progressing Towards Goal  Goal: *Absence of infection signs and symptoms  Outcome: Progressing Towards Goal  Goal: *Optimal pain control at patient's stated goal  Outcome: Progressing Towards Goal  Goal: *Skin integrity maintained  Outcome: Progressing Towards Goal  Goal: *Fluid volume balance  Outcome: Progressing Towards Goal  Goal: *Optimize nutritional status  Outcome: Progressing Towards Goal  Goal: *Anxiety reduced or absent  Outcome: Progressing Towards Goal  Goal: *Progressive mobility and function (eg: ADL's)  Outcome: Progressing Towards Goal

## 2018-01-27 NOTE — Other (Signed)
Bedside and Verbal shift change report given by Marily Memos, RN (offgoing nurse) to Daeja (oncoming nurse). Report included the following information SBAR, Kardex and MAR.

## 2018-01-27 NOTE — Progress Notes (Addendum)
[email or web address]  [phone]    Gastroenterology follow up-Progress note    Impression:  1. Cholelithiasis - HIDA shows likely chronic cholecystitis, symptomatic, lap chole planned for tomorrow with Dr. Mayford KnifeWilliams  2. RUQ pain/nausea  3. Abnormal LFTs - ? Due to HCV, no IV drugs for >8 yr, denies ETOH  4. HCV - treatment naive  5. Anxiety    Plan:  1. Agree with lap chole for tomorrow  2. Await HCV PCR, will treat as OP if viral load present  3. Monitor LFTs  4. Continue with antiemetics and analgesia   5. Continue with current medical management per primary team    Will sign off-Thank you for this consultation and the opportunity to participate in the care of this patient. Please do not hesitate to call with any questions or concerns, or should event occur that may necessitate additional GI evaluation.      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       Eyes: conjunctiva normal, EOM normal   Neck: ROM normal, supple and trachea normal   Cardiovascular: heart normal, intact distal pulses, normal rate and regular rhythm   Pulmonary/Chest Wall: breath sounds normal and effort normal   Abdominal: appearance normal, bowel sounds normal and soft, non-acute, moderate RUQ ttp     Patient Active Problem List   Diagnosis Code   ??? Cholecystitis K81.9   ??? Acute cholecystitis K81.0         Visit Vitals  BP 105/53 (BP 1 Location: Right arm, BP Patient Position: At rest)   Pulse 76   Temp 97.4 ??F (36.3 ??C)   Resp 12   Ht 6\' 5"  (1.956 m)   Wt 74.8 kg (165 lb)   SpO2 96%   BMI 19.57 kg/m??           Intake/Output Summary (Last 24 hours) at 01/27/2018 1016  Last data filed at 01/27/2018 1014  Gross per 24 hour   Intake 4528.75 ml   Output 1350 ml   Net 3178.75 ml       CBC w/Diff    Lab Results   Component Value Date/Time    WBC 10.5 01/25/2018 11:02 AM    RBC 4.87 01/25/2018 11:02 AM     HGB 14.2 01/25/2018 11:02 AM    HCT 42.1 01/25/2018 11:02 AM    MCV 86.4 01/25/2018 11:02 AM    MCH 29.2 01/25/2018 11:02 AM    MCHC 33.7 01/25/2018 11:02 AM    RDW 16.5 (H) 01/25/2018 11:02 AM    PLT 268 01/25/2018 11:02 AM    Lab Results   Component Value Date/Time    GRANS 62 01/25/2018 11:02 AM    LYMPH 25 01/25/2018 11:02 AM    EOS 4 01/25/2018 11:02 AM    BASOS 1 01/25/2018 11:02 AM      Basic Metabolic Profile   Recent Labs     01/25/18  1102   NA 143   K 4.1   CL 110   CO2 28   BUN 10   CA 8.9        Hepatic Function    Lab Results   Component Value Date/Time    ALB 3.7 01/25/2018 11:02 AM    TP 6.9 01/25/2018 11:02 AM    AP 145 (H) 01/25/2018 11:02 AM    Lab  Results   Component Value Date/Time    SGOT 98 (H) 01/25/2018 11:02 AM          Coags   Recent Labs     01/25/18  1130   PTP 11.6   INR 0.9   APTT 26.6               Gustavus Messing, PA    [facility].  [email or web address]  Phone: [phone]  Pager: [phone]

## 2018-01-27 NOTE — Progress Notes (Signed)
Benjimin Hadden P. Mayford Knife, [name]  PROGRESS NOTE    Name: Ian Franklin MRN: [id number]   DOB: 10-Feb-1986 Hospital: [facility]   Date: 01/27/2018 Admission Date: 01/25/2018 10:40 AM     Hospital Day: 3     Subjective:  Patient again voicing has high tolerance to pain medicine secondary to his history of heroin abuse.  Otherwise no acute overnight events.  The HIDA scan results are noted as chronic cholecystitis with delayed filling of the gallbladder.  Objective:  Vitals:    01/26/18 1959 01/26/18 2125 01/27/18 0613 01/27/18 1047   BP: 97/52 115/72 105/53 120/68   Pulse: 78 60 76 74   Resp: 16 14 12 15    Temp: 98.2 ??F (36.8 ??C) 98.8 ??F (37.1 ??C) 97.4 ??F (36.3 ??C) 98.5 ??F (36.9 ??C)   SpO2: 98% 98% 96% 98%   Weight:       Height:         Date 01/26/18 0700 - 01/27/18 0659 01/27/18 0700 - 01/28/18 0659   Shift 0700-1859 1900-0659 24 Hour Total 0700-1859 1900-0659 24 Hour Total   INTAKE   P.O.  240 240 320  320     P.O.  240 240 320  320   I.V.(mL/kg/hr) 4098.1(1.9) 1431.3(1.6) 1478.2(9.5)        Volume (lactated Ringers infusion) 2537.5 1431.3 3968.8      Shift Total(mL/kg) 2537.5(33.9) 6213.0(86.5) 7846.8(56.2) 320(4.3)  320(4.3)   OUTPUT   Urine(mL/kg/hr)  800(0.9) 800(0.4) 550  550     Urine Voided  800 800 550  550   Shift Total(mL/kg)  800(10.7) 800(10.7) 550(7.3)  550(7.3)   NET 2537.5 871.3 3408.8 -230  -230   Weight (kg) 74.8 74.8 74.8 74.8 74.8 74.8         Physical Exam:    General: Awake and alert, oriented x4, no apparent distress   Abdomen: abdomen is soft with right upper quadrant tenderness.   No masses, organomegaly or guarding    Labs:  No results found for this or any previous visit (from the past 24 hour(s)).  All Micro Results     None          Current Medications:  Current Facility-Administered Medications   Medication Dose Route Frequency Provider Last Rate Last Dose   ??? sodium chloride (NS) flush 5-40 mL  5-40 mL IntraVENous Q8H Marylyn Ishihara, MD   10 mL at 01/26/18 0654   ??? sodium  chloride (NS) flush 5-40 mL  5-40 mL IntraVENous PRN Marylyn Ishihara, MD   10 mL at 01/27/18 0542   ??? lactated Ringers infusion  125 mL/hr IntraVENous CONTINUOUS Marylyn Ishihara, MD 125 mL/hr at 01/26/18 1958 125 mL/hr at 01/26/18 1958   ??? HYDROmorphone (DILAUDID) injection 1 mg  1 mg IntraVENous Q4H PRN Marylyn Ishihara, MD   1 mg at 01/27/18 1026   ??? naloxone (NARCAN) injection 0.4 mg  0.4 mg IntraVENous PRN Marylyn Ishihara, MD       ??? ondansetron [facility]) injection 4 mg  4 mg IntraVENous Q4H PRN Marylyn Ishihara, MD       ??? diphenhydrAMINE (BENADRYL) injection 12.5 mg  12.5 mg IntraVENous Q4H PRN Marylyn Ishihara, MD       ??? LORazepam (ATIVAN) injection 1 mg  1 mg IntraVENous Q8H PRN Marylyn Ishihara, MD       ??? heparin (porcine) injection 5,000 Units  5,000 Units SubCUTAneous Q8H Marylyn Ishihara, MD   5,000  Units at 01/27/18 4403   ??? oxyCODONE-acetaminophen (PERCOCET) 5-325 mg per tablet 2 Tab  2 Tab Oral Q4H PRN Marylyn Ishihara, MD   2 Tab at 01/27/18 4742       Chart and notes reviewed. Data reviewed. I have evaluated and examined the patient.        IMPRESSION:   ?? Patient with chronic calculus cholecystitis and hepatitis C.      PLAN:/DISCUSION:   ?? Robot-assisted laparoscopic cholecystectomy with liver biopsy  ?? Consent on chart  ?? Preoperative orders written        Marylyn Ishihara, MD

## 2018-01-27 NOTE — Progress Notes (Signed)
Progress Notes by Gustavus Messing, PA at 01/27/18 1016                Author: Marjory Sneddon, Ginette Pitman, PA  Service: Gastroenterology  Author Type: Physician Assistant       Filed: 01/28/18 0826  Date of Service: 01/27/18 1016  Status: Addendum          Editor: Schrider, Ginette Pitman, PA (Physician Assistant)       Related Notes: Original Note by Gustavus Messing, PA (Physician Assistant) filed at 01/27/18  1023          Cosigner: Jacques Navy, MD at 01/29/18 1359                           [email or web address]   [phone]      Gastroenterology follow up-Progress note      Impression:   1. Cholelithiasis - HIDA shows likely chronic cholecystitis, symptomatic, lap chole planned for tomorrow with Dr. Mayford Knife   2. RUQ pain/nausea   3. Abnormal LFTs - ? Due to HCV, no IV drugs for >8 yr, denies ETOH   4. HCV - treatment naive   5. Anxiety      Plan:   1. Agree with lap chole for tomorrow   2. Await HCV PCR, will treat as OP if viral load present   3. Monitor LFTs   4. Continue with antiemetics and analgesia    5. Continue with current medical management per primary team      Will sign off-Thank you for this consultation and the opportunity to participate in the care of this patient. Please do not hesitate to call with any questions or concerns, or should  event occur that may necessitate additional GI evaluation.         Chief Complaint: RUQ pain         Subjective:  Increased pain today, very uncomfortable, feels Dilaudid needs to be increased to 2mg  vs 1mg  given his resistance due to methadone  use.             Eyes:  conjunctiva normal, EOM normal        Neck:  ROM normal, supple and trachea normal     Cardiovascular:  heart normal, intact distal pulses, normal rate and regular rhythm     Pulmonary/Chest Wall:  breath sounds normal and effort normal     Abdominal:  appearance normal, bowel sounds normal and soft, non-acute, moderate RUQ ttp          Patient Active Problem List        Diagnosis  Code          ?  Cholecystitis  K81.9         ?  Acute cholecystitis  K81.0              Visit Vitals      BP  105/53 (BP 1 Location: Right arm, BP Patient Position: At rest)     Pulse  76     Temp  97.4 ??F (36.3 ??C)     Resp  12     Ht  6\' 5"  (1.956 m)     Wt  74.8 kg (165 lb)     SpO2  96%        BMI  19.57 kg/m??                 Intake/Output Summary (Last 24 hours) at  01/27/2018 1016   Last data filed at 01/27/2018 1014     Gross per 24 hour        Intake  4528.75 ml        Output  1350 ml        Net  3178.75 ml              CBC w/Diff       Lab Results      Component  Value  Date/Time        WBC  10.5  01/25/2018 11:02 AM        RBC  4.87  01/25/2018 11:02 AM        HGB  14.2  01/25/2018 11:02 AM        HCT  42.1  01/25/2018 11:02 AM        MCV  86.4  01/25/2018 11:02 AM        MCH  29.2  01/25/2018 11:02 AM        MCHC  33.7  01/25/2018 11:02 AM        RDW  16.5 (H)  01/25/2018 11:02 AM        PLT  268  01/25/2018 11:02 AM           Lab Results      Component  Value  Date/Time        GRANS  62  01/25/2018 11:02 AM        LYMPH  25  01/25/2018 11:02 AM        EOS  4  01/25/2018 11:02 AM        BASOS  1  01/25/2018 11:02 AM                Basic Metabolic Profile     Recent Labs         01/25/18   1102      NA  143      K  4.1      CL  110      CO2  28      BUN  10      CA  8.9                   Hepatic Function       Lab Results      Component  Value  Date/Time        ALB  3.7  01/25/2018 11:02 AM        TP  6.9  01/25/2018 11:02 AM        AP  145 (H)  01/25/2018 11:02 AM           Lab Results      Component  Value  Date/Time        SGOT  98 (H)  01/25/2018 11:02 AM                      Coags     Recent Labs         01/25/18   1130      PTP  11.6      INR  0.9      APTT  26.6                          Jakalyn Kratky A Schrider, PA      [facility].   [email or web address]   Phone: [phone]   Pager: [phone]

## 2018-01-28 LAB — DRUG SCREEN, URINE
AMPHETAMINES: NEGATIVE
Amphetamine Screen, Urine: NEGATIVE
BARBITURATES: NEGATIVE
BENZODIAZEPINES: NEGATIVE
Barbiturate Screen, Urine: NEGATIVE
Benzodiazepine Screen, Urine: NEGATIVE
COCAINE: NEGATIVE
Cocaine Screen Urine: NEGATIVE
METHADONE: NEGATIVE
Methadone Screen, Urine: NEGATIVE
OPIATES: POSITIVE — AB
Opiate Screen, Urine: POSITIVE — AB
PCP Screen, Urine: NEGATIVE
PCP(PHENCYCLIDINE): NEGATIVE
THC (TH-CANNABINOL): NEGATIVE
THC Screen, Urine: NEGATIVE

## 2018-01-28 LAB — CBC W/O DIFF
HCT: 40.2 % (ref 36.0–48.0)
HGB: 13.3 g/dL (ref 13.0–16.0)
MCH: 29 PG (ref 24.0–34.0)
MCHC: 33.1 g/dL (ref 31.0–37.0)
MCV: 87.6 FL (ref 74.0–97.0)
MPV: 10.7 FL (ref 9.2–11.8)
PLATELET: 214 10*3/uL (ref 135–420)
RBC: 4.59 M/uL — ABNORMAL LOW (ref 4.70–5.50)
RDW: 15.8 % — ABNORMAL HIGH (ref 11.6–14.5)
WBC: 8 10*3/uL (ref 4.6–13.2)

## 2018-01-28 LAB — CBC
Hematocrit: 40.2 % (ref 36.0–48.0)
Hemoglobin: 13.3 g/dL (ref 13.0–16.0)
MCH: 29 PG (ref 24.0–34.0)
MCHC: 33.1 g/dL (ref 31.0–37.0)
MCV: 87.6 FL (ref 74.0–97.0)
MPV: 10.7 FL (ref 9.2–11.8)
Platelets: 214 10*3/uL (ref 135–420)
RBC: 4.59 M/uL — ABNORMAL LOW (ref 4.70–5.50)
RDW: 15.8 % — ABNORMAL HIGH (ref 11.6–14.5)
WBC: 8 10*3/uL (ref 4.6–13.2)

## 2018-01-28 MED ORDER — KETAMINE 50 MG/ML IJ SOLN
50 mg/mL | INTRAMUSCULAR | Status: DC | PRN
Start: 2018-01-28 — End: 2018-01-28
  Administered 2018-01-28: 14:00:00 via INTRAVENOUS

## 2018-01-28 MED ORDER — NEOSTIGMINE METHYLSULFATE 5 MG/5 ML (1 MG/ML) IV SYRINGE
5 mg/ mL (1 mg/mL) | INTRAVENOUS | Status: DC | PRN
Start: 2018-01-28 — End: 2018-01-28
  Administered 2018-01-28: 15:00:00 via INTRAVENOUS

## 2018-01-28 MED ORDER — SODIUM CHLORIDE 0.9 % IJ SYRG
Freq: Three times a day (TID) | INTRAMUSCULAR | Status: DC
Start: 2018-01-28 — End: 2018-01-28
  Administered 2018-01-28: 13:00:00 via INTRAVENOUS

## 2018-01-28 MED ORDER — HYDROMORPHONE 2 MG/ML INJECTION SOLUTION
2 mg/mL | INTRAMUSCULAR | Status: AC
Start: 2018-01-28 — End: ?

## 2018-01-28 MED ORDER — LORAZEPAM 2 MG/ML IJ SOLN
2 mg/mL | Freq: Once | INTRAMUSCULAR | Status: AC
Start: 2018-01-28 — End: 2018-01-28
  Administered 2018-01-28: 13:00:00 via INTRAVENOUS

## 2018-01-28 MED ORDER — INDOCYANINE GREEN 25 MG INJECTION
25 mg | INTRAMUSCULAR | Status: AC
Start: 2018-01-28 — End: 2018-01-28
  Administered 2018-01-28: 13:00:00 via INTRAVENOUS

## 2018-01-28 MED ORDER — CEFOTETAN 2 GRAM SOLUTION FOR INJECTION
2 gram | Freq: Once | INTRAMUSCULAR | Status: AC
Start: 2018-01-28 — End: 2018-01-28
  Administered 2018-01-28: 14:00:00 via INTRAVENOUS

## 2018-01-28 MED ORDER — FENTANYL CITRATE (PF) 50 MCG/ML IJ SOLN
50 mcg/mL | INTRAMUSCULAR | Status: DC | PRN
Start: 2018-01-28 — End: 2018-01-28
  Administered 2018-01-28 (×3): via INTRAVENOUS

## 2018-01-28 MED ORDER — LIDOCAINE (PF) 20 MG/ML (2 %) IJ SOLN
20 mg/mL (2 %) | INTRAMUSCULAR | Status: DC | PRN
Start: 2018-01-28 — End: 2018-01-28
  Administered 2018-01-28: 14:00:00 via INTRAVENOUS

## 2018-01-28 MED ORDER — DEXAMETHASONE SODIUM PHOSPHATE 4 MG/ML IJ SOLN
4 mg/mL | INTRAMUSCULAR | Status: DC | PRN
Start: 2018-01-28 — End: 2018-01-28
  Administered 2018-01-28: 15:00:00 via INTRAVENOUS

## 2018-01-28 MED ORDER — ONDANSETRON (PF) 4 MG/2 ML INJECTION
4 mg/2 mL | Freq: Once | INTRAMUSCULAR | Status: AC
Start: 2018-01-28 — End: 2018-01-28
  Administered 2018-01-28: 15:00:00 via INTRAVENOUS

## 2018-01-28 MED ORDER — HYDROMORPHONE (PF) 2 MG/ML IJ SOLN
2 mg/mL | INTRAMUSCULAR | Status: DC | PRN
Start: 2018-01-28 — End: 2018-01-28
  Administered 2018-01-28: 15:00:00 via INTRAVENOUS

## 2018-01-28 MED ORDER — ROCURONIUM 10 MG/ML IV
10 mg/mL | INTRAVENOUS | Status: DC | PRN
Start: 2018-01-28 — End: 2018-01-28
  Administered 2018-01-28 (×2): via INTRAVENOUS

## 2018-01-28 MED ORDER — SODIUM CHLORIDE 0.9 % IJ SYRG
INTRAMUSCULAR | Status: DC | PRN
Start: 2018-01-28 — End: 2018-01-28

## 2018-01-28 MED ORDER — LACTATED RINGERS IV
INTRAVENOUS | Status: DC
Start: 2018-01-28 — End: 2018-01-28
  Administered 2018-01-28 (×3): via INTRAVENOUS

## 2018-01-28 MED ORDER — FENTANYL CITRATE (PF) 50 MCG/ML IJ SOLN
50 mcg/mL | INTRAMUSCULAR | Status: AC
Start: 2018-01-28 — End: 2018-01-28
  Administered 2018-01-28: 15:00:00 via INTRAVENOUS

## 2018-01-28 MED ORDER — MIDAZOLAM 1 MG/ML IJ SOLN
1 mg/mL | INTRAMUSCULAR | Status: DC | PRN
Start: 2018-01-28 — End: 2018-01-28
  Administered 2018-01-28: 14:00:00 via INTRAVENOUS

## 2018-01-28 MED ORDER — MIDAZOLAM 1 MG/ML IJ SOLN
1 mg/mL | INTRAMUSCULAR | Status: AC
Start: 2018-01-28 — End: ?

## 2018-01-28 MED ORDER — GLYCOPYRROLATE 0.2 MG/ML IJ SOLN
0.2 mg/mL | INTRAMUSCULAR | Status: DC | PRN
Start: 2018-01-28 — End: 2018-01-28
  Administered 2018-01-28: 15:00:00 via INTRAVENOUS

## 2018-01-28 MED ORDER — HYDROMORPHONE 2 MG/ML INJECTION SOLUTION
2 mg/mL | INTRAMUSCULAR | Status: AC
Start: 2018-01-28 — End: 2018-01-28
  Administered 2018-01-28: 15:00:00 via INTRAVENOUS

## 2018-01-28 MED ORDER — FAMOTIDINE 20 MG TAB
20 mg | Freq: Once | ORAL | Status: AC
Start: 2018-01-28 — End: 2018-01-28
  Administered 2018-01-28: 13:00:00 via ORAL

## 2018-01-28 MED ORDER — PROMETHAZINE 25 MG/ML INJECTION
25 mg/mL | Freq: Once | INTRAMUSCULAR | Status: AC
Start: 2018-01-28 — End: 2018-01-28
  Administered 2018-01-28: 16:00:00 via INTRAVENOUS

## 2018-01-28 MED ORDER — KETOROLAC TROMETHAMINE 15 MG/ML INJECTION
15 mg/mL | Freq: Three times a day (TID) | INTRAMUSCULAR | Status: DC
Start: 2018-01-28 — End: 2018-01-29
  Administered 2018-01-29 (×2): via INTRAVENOUS

## 2018-01-28 MED ORDER — HYDROMORPHONE 2 MG/ML INJECTION SOLUTION
2 mg/mL | INTRAMUSCULAR | Status: AC | PRN
Start: 2018-01-28 — End: 2018-01-28
  Administered 2018-01-28 (×3): via INTRAVENOUS

## 2018-01-28 MED ORDER — FENTANYL CITRATE (PF) 50 MCG/ML IJ SOLN
50 mcg/mL | INTRAMUSCULAR | Status: AC
Start: 2018-01-28 — End: ?

## 2018-01-28 MED ORDER — SODIUM CHLORIDE 0.9 % IJ SYRG
Freq: Three times a day (TID) | INTRAMUSCULAR | Status: DC
Start: 2018-01-28 — End: 2018-01-28

## 2018-01-28 MED ORDER — ONDANSETRON (PF) 4 MG/2 ML INJECTION
4 mg/2 mL | INTRAMUSCULAR | Status: DC | PRN
Start: 2018-01-28 — End: 2018-01-28
  Administered 2018-01-28: 15:00:00 via INTRAVENOUS

## 2018-01-28 MED ORDER — EPHEDRINE SULFATE 50 MG/ML INJECTION SOLUTION
50 mg/mL | INTRAMUSCULAR | Status: DC | PRN
Start: 2018-01-28 — End: 2018-01-28
  Administered 2018-01-28: 15:00:00 via INTRAVENOUS

## 2018-01-28 MED ORDER — LACTATED RINGERS IV
INTRAVENOUS | Status: DC
Start: 2018-01-28 — End: 2018-01-28
  Administered 2018-01-28: 16:00:00 via INTRAVENOUS

## 2018-01-28 MED ORDER — BUPIVACAINE-EPINEPHRINE (PF) 0.25 %-1:200,000 IJ SOLN
0.25 %-1:200,000 | INTRAMUSCULAR | Status: DC | PRN
Start: 2018-01-28 — End: 2018-01-28
  Administered 2018-01-28: 15:00:00 via SUBCUTANEOUS

## 2018-01-28 MED ORDER — KETAMINE 50 MG/ML (1 ML) INTRAVENOUS SYRINGE
50 mg/mL (1 mL) | INTRAVENOUS | Status: AC
Start: 2018-01-28 — End: ?

## 2018-01-28 MED ORDER — KETOROLAC TROMETHAMINE 30 MG/ML INJECTION
30 mg/mL (1 mL) | INTRAMUSCULAR | Status: DC | PRN
Start: 2018-01-28 — End: 2018-01-28
  Administered 2018-01-28: 15:00:00 via INTRAVENOUS

## 2018-01-28 MED ORDER — FENTANYL CITRATE (PF) 50 MCG/ML IJ SOLN
50 mcg/mL | INTRAMUSCULAR | Status: DC | PRN
Start: 2018-01-28 — End: 2018-01-28
  Administered 2018-01-28 (×2): via INTRAVENOUS

## 2018-01-28 MED ORDER — BUPIVACAINE-EPINEPHRINE (PF) 0.25 %-1:200,000 IJ SOLN
0.25 %-1:200,000 | INTRAMUSCULAR | Status: AC
Start: 2018-01-28 — End: ?

## 2018-01-28 MED ORDER — PROPOFOL 10 MG/ML IV EMUL
10 mg/mL | INTRAVENOUS | Status: DC | PRN
Start: 2018-01-28 — End: 2018-01-28
  Administered 2018-01-28: 14:00:00 via INTRAVENOUS

## 2018-01-28 MED FILL — DEXAMETHASONE SODIUM PHOSPHATE 4 MG/ML IJ SOLN: 4 mg/mL | INTRAMUSCULAR | Qty: 4

## 2018-01-28 MED FILL — LACTATED RINGERS IV: INTRAVENOUS | Qty: 1000

## 2018-01-28 MED FILL — HEPARIN (PORCINE) 5,000 UNIT/ML IJ SOLN: 5000 unit/mL | INTRAMUSCULAR | Qty: 1

## 2018-01-28 MED FILL — CEFOTETAN 2 GRAM SOLUTION FOR INJECTION: 2 gram | INTRAMUSCULAR | Qty: 2

## 2018-01-28 MED FILL — HYDROMORPHONE 1 MG/ML INJECTION SOLUTION: 1 mg/mL | INTRAMUSCULAR | Qty: 1

## 2018-01-28 MED FILL — ROCURONIUM 10 MG/ML IV: 10 mg/mL | INTRAVENOUS | Qty: 50

## 2018-01-28 MED FILL — OXYCODONE-ACETAMINOPHEN 5 MG-325 MG TAB: 5-325 mg | ORAL | Qty: 2

## 2018-01-28 MED FILL — DIPRIVAN 10 MG/ML INTRAVENOUS EMULSION: 10 mg/mL | INTRAVENOUS | Qty: 260

## 2018-01-28 MED FILL — KETAMINE 50 MG/ML (1 ML) INTRAVENOUS SYRINGE: 50 mg/mL (1 mL) | INTRAVENOUS | Qty: 1

## 2018-01-28 MED FILL — BD POSIFLUSH NORMAL SALINE 0.9 % INJECTION SYRINGE: INTRAMUSCULAR | Qty: 40

## 2018-01-28 MED FILL — MIDAZOLAM 1 MG/ML IJ SOLN: 1 mg/mL | INTRAMUSCULAR | Qty: 2

## 2018-01-28 MED FILL — HYDROMORPHONE 2 MG/ML INJECTION SOLUTION: 2 mg/mL | INTRAMUSCULAR | Qty: 1

## 2018-01-28 MED FILL — EPHEDRINE 50 MG/5 ML (10 MG/ML) IN NS IV SYRINGE: 50 mg/5 mL (10 mg/mL) | INTRAVENOUS | Qty: 10

## 2018-01-28 MED FILL — LIDOCAINE (PF) 20 MG/ML (2 %) IJ SOLN: 20 mg/mL (2 %) | INTRAMUSCULAR | Qty: 80

## 2018-01-28 MED FILL — PROMETHAZINE 25 MG/ML INJECTION: 25 mg/mL | INTRAMUSCULAR | Qty: 1

## 2018-01-28 MED FILL — FAMOTIDINE 20 MG TAB: 20 mg | ORAL | Qty: 1

## 2018-01-28 MED FILL — DIPHENHYDRAMINE HCL 50 MG/ML IJ SOLN: 50 mg/mL | INTRAMUSCULAR | Qty: 1

## 2018-01-28 MED FILL — BUPIVACAINE-EPINEPHRINE (PF) 0.25 %-1:200,000 IJ SOLN: 0.25 %-1:200,000 | INTRAMUSCULAR | Qty: 30

## 2018-01-28 MED FILL — INDOCYANINE GREEN 25 MG INJECTION: 25 mg | INTRAMUSCULAR | Qty: 25

## 2018-01-28 MED FILL — ONDANSETRON (PF) 4 MG/2 ML INJECTION: 4 mg/2 mL | INTRAMUSCULAR | Qty: 2

## 2018-01-28 MED FILL — FENTANYL CITRATE (PF) 50 MCG/ML IJ SOLN: 50 mcg/mL | INTRAMUSCULAR | Qty: 2

## 2018-01-28 MED FILL — GLYCOPYRROLATE 0.2 MG/ML IJ SOLN: 0.2 mg/mL | INTRAMUSCULAR | Qty: 0.4

## 2018-01-28 MED FILL — LORAZEPAM 2 MG/ML IJ SOLN: 2 mg/mL | INTRAMUSCULAR | Qty: 1

## 2018-01-28 MED FILL — KETOROLAC TROMETHAMINE 15 MG/ML INJECTION: 15 mg/mL | INTRAMUSCULAR | Qty: 30

## 2018-01-28 MED FILL — KETAMINE 50 MG/ML (1 ML) INTRAVENOUS SYRINGE: 50 mg/mL (1 mL) | INTRAVENOUS | Qty: 50

## 2018-01-28 MED FILL — LACTATED RINGERS IV: INTRAVENOUS | Qty: 100

## 2018-01-28 MED FILL — NEOSTIGMINE METHYLSULFATE 3 MG/3 ML (1 MG/ML) IV SYRINGE: 3 mg/ mL (1 mg/mL) | INTRAVENOUS | Qty: 3

## 2018-01-28 MED FILL — ONDANSETRON (PF) 4 MG/2 ML INJECTION: 4 mg/2 mL | INTRAMUSCULAR | Qty: 4

## 2018-01-28 NOTE — Other (Signed)
Bedside and Verbal shift change report given to Haze RushingKathleen D Neild, RN (oncoming nurse) by Kathie DikeShantina Robinson, RN (offgoing nurse). Report included the following information Kardex and MAR.     0700    End of Shift Note     Bedside and verbal shift change report given to Wells GuilesFee Vines,RN (On coming nurse) by Haze RushingKathleen D Neild, RN (Off going nurse).  Report included the following information:Procedure Summary, Intake/Output, MAR, Recent Results, Med Rec Status, and SBAR  (Situation, Background, Assessment and Recommendations)    SBAR Recommendations: give Toradol, helps the best    Issues for Provider to address none noted        Activity This Shift      []  Resting in bed   []  TDWB   []  Dangled     []  Chair Ambulated to     [x]  Bathroom     []  BSC     []  Refused Ambulated In    []  Room    []  Hallway    []  Bedrest ordered   Bladder Scan  Voiding Status []  Yes  [x]  Void [x]  No  []  Foley []  N/A  []  I&O Cath   Blood Sugars Managed []  Yes [x]  No []  N/A   Bowel Movement  Passing Gas []  Yes  []  Yes [x]  No  []  No    CHG Bath Done     Before Surgery     After Surgery   [x]  Yes  [x]  Yes   []  No  []  No   []  N/A  []  N/A   Drain Removed []  Yes [x]  No []  N/A   Dressing Checked  Dressing Changed [x]  Yes  []  Yes []  No  [x]  No []  N/A  []  N/A   Nausea/Vomiting [x]  Yes []  No    Ice Packs Changed [x]  Yes []  No []  N/A   Incentive Spirometer  []  Yes []  No Volume 2000   SCD Pumps On Bilaterally  Ankle Pumping  [x]  Yes  []  Yes []  Yes  [x]  No []  N/A  []  N/A    Telemetry Monitoring []  Yes [x]  No Rhythm N/A   Up to Chair for Meals [x]  Yes []  No []  N/A

## 2018-01-28 NOTE — Op Note (Addendum)
[facility]                               [address]                          [place], [place] [id number]                                 OPERATIVE REPORT    PATIENT:    Ian CaroliJohn A Kucharski  MRN:           [id number]   DATE:   01/28/2018  BILLING:     [id number]  ROOM:       PH/PL  ATTENDING:   Marylyn Ishiharaharles P Melayna Robarts, MD  DICTATING:   Marylyn Ishiharaharles P Celenia Hruska, MD      PREOPERATIVE DIAGNOSIS: Chronic calculus cholecystitis    POSTOPERATIVE DIAGNOSIS: Same    PROCEDURES PERFORMED: Robotic assisted laparoscopic cholecystectomy    SURGEON: Thad Rangerharles Betzalel Umbarger, MD    ASSISTANT: Toribio HarbourMonica Jones, SA    ANESTHESIA: General and local 0.25% Marcaine with epinephrine.    FINDINGS: chronic calculus cholecystectomy    SPECIMENS REMOVED: Gallbladder.    ESTIMATED BLOOD LOSS: 10 mL    FLUIDS:  1000 mL    OPERATIVE INDICATION: Chronic calculus cholecystitis    DESCRIPTION OF PROCEDURE: The patient was identified in the holding area, where consent for laparoscopic, possible open, cholecystectomy was verified. Once in the operating room, the patient was placed supine and general anesthesia was induced. The abdomen was prepped and draped in sterile fashion using chlorhexidine solution and sterile drapes. Patient had 3 mg of indocyanine green injected in the holding area.  The robotic and laparoscopic equipment were assembled and proper functioning was visualized prior to making the initial incision. The local anesthetic was infiltrated into the skin and deep dermal tissues at each incision prior to the incision being made.    The initial trocar was placed at the umbilicus using the optiview technique.  The 5 mm 0 degree scope was assembled to the 8 mm blunt tipped trocar.  Safe entry into the peritoneal cavity was visualized.  The insufflation was obtained using carbon dioxide gas to 15 mmHg. The 8 mm blunt tipped trocar was then placed in the left upper quadrant.   An 8 mm trocar in the right lower quadrant.  A 5 mm trocar was placed in the right subcostal position.   The patient was placed in reverse Trendelenburg with the right side up.  The surgical cart was guided into position and the trocars were docked.     The liver had no evidence of cirrhosis therefore no liver biopsy was necessary.  The fundus of the gallbladder was identified and retracted anterior,  cephalad, and lateral.  Blunt dissection was employed to visualize the cystic duct infundibulum junction. Firefly was used visualize the cystic duct and common bile duct. The cystic duct was dissected free circumferentially and clipped and ligated in standard fashion. The cystic artery was identified and clipped and ligated in standard fashion. The gallbladder was removed from the gallbladder fossa using electrocautery for both hemostasis and dissection. The gallbladder was removed from the peritoneal cavity at the umbilical trocar. Trocar was then reinserted. The area of the clips was inspected and found to be free of  any bleeding or bile leak.  The pneumoperitoneum  was allowed to deflate.  4-0 Monocryl suture was used to close the incision sites in interrupted stitches.  Mastisol, Steri-Strips and Band-Aids were used to create sterile dressings.. The patient tolerated the procedure very well.    DISPOSITION: He was extubated and stable upon transport to the recovery  room.      Lia Hopping, MD, FACS

## 2018-01-28 NOTE — Progress Notes (Signed)
Seher Schlagel P. Mayford KnifeWilliams, [name]  PROGRESS NOTE    Name: Ian CaroliJohn A Franklin MRN: [id number]   DOB: 03/23/1986 [facility]   Date: 01/28/2018 Admission Date: 01/25/2018 10:40 AM     Hospital Day: 4  Day of Surgery  Subjective:  Pt without acute overnight events.  Objective:  Vitals:    01/27/18 1846 01/27/18 2105 01/28/18 0531 01/28/18 0908   BP: 118/70 119/70 90/54 134/72   Pulse: 72 60 64 67   Resp: 16 17 17 20    Temp: 98.2 ??F (36.8 ??C) 98 ??F (36.7 ??C) 97.4 ??F (36.3 ??C) 98.4 ??F (36.9 ??C)   SpO2: 100% 97% 95% 97%   Weight:       Height:         Date 01/27/18 0700 - 01/28/18 0659 01/28/18 0700 - 01/29/18 0659   Shift 0700-1859 1900-0659 24 Hour Total 0700-1859 1900-0659 24 Hour Total   INTAKE   P.O. 320 360 680        P.O. 320 360 680      Shift Total(mL/kg) 320(4.3) 360(4.8) 680(9.1)      OUTPUT   Urine(mL/kg/hr) 550(0.6)  550(0.3)        Urine Voided 550  550      Shift Total(mL/kg) 550(7.3)  550(7.3)      NET -230 360 130      Weight (kg) 74.8 74.8 74.8 74.8 74.8 74.8         Physical Exam:    General: Awake and alert, oriented x4, no apparent distress   Abdomen: abdomen is soft with right upper quadrant tenderness.   No masses, organomegaly or guarding    Labs:  Recent Results (from the past 24 hour(s))   DRUG SCREEN, URINE    Collection Time: 01/28/18  8:30 AM   Result Value Ref Range    BENZODIAZEPINES NEGATIVE  NEG      BARBITURATES NEGATIVE  NEG      THC (TH-CANNABINOL) NEGATIVE  NEG      OPIATES POSITIVE (A) NEG      PCP(PHENCYCLIDINE) NEGATIVE  NEG      COCAINE NEGATIVE  NEG      AMPHETAMINES NEGATIVE  NEG      METHADONE NEGATIVE  NEG      HDSCOM (NOTE)      All Micro Results     None          Current Medications:  Current Facility-Administered Medications   Medication Dose Route Frequency Provider Last Rate Last Dose   ??? sodium chloride (NS) flush 5-40 mL  5-40 mL IntraVENous Q8H Miquel DunnMyers, Roderick L, CRNA       ??? sodium chloride (NS) flush 5-40 mL  5-40 mL IntraVENous PRN Miquel DunnMyers,  Roderick L, CRNA       ??? lactated Ringers infusion  75 mL/hr IntraVENous CONTINUOUS Miquel DunnMyers, Roderick L, CRNA       ??? cefoTEtan (CEFOTAN) 2 g in sterile water (preservative free) 20 mL IV syringe  2 g IntraVENous ONCE Marylyn IshiharaWilliams, Raoul Ciano P, MD       ??? sodium chloride (NS) flush 5-40 mL  5-40 mL IntraVENous Q8H Marylyn IshiharaWilliams, Matthews Franks P, MD   10 mL at 01/28/18 0602   ??? sodium chloride (NS) flush 5-40 mL  5-40 mL IntraVENous PRN Marylyn IshiharaWilliams, Aeriel Boulay P, MD   10 mL at 01/27/18 0542   ??? lactated Ringers infusion  125 mL/hr IntraVENous CONTINUOUS Marylyn IshiharaWilliams, Qasim Diveley P, MD 125 mL/hr at 01/28/18 0407 125 mL/hr at 01/28/18 0407   ???  HYDROmorphone (DILAUDID) injection 1 mg  1 mg IntraVENous Q4H PRN Marylyn Ishihara, MD   1 mg at 01/28/18 0745   ??? naloxone (NARCAN) injection 0.4 mg  0.4 mg IntraVENous PRN Marylyn Ishihara, MD       ??? ondansetron [facility]) injection 4 mg  4 mg IntraVENous Q4H PRN Marylyn Ishihara, MD       ??? diphenhydrAMINE (BENADRYL) injection 12.5 mg  12.5 mg IntraVENous Q4H PRN Marylyn Ishihara, MD       ??? LORazepam (ATIVAN) injection 1 mg  1 mg IntraVENous Q8H PRN Marylyn Ishihara, MD       ??? heparin (porcine) injection 5,000 Units  5,000 Units SubCUTAneous Q8H Marylyn Ishihara, MD   Stopped at 01/27/18 2353   ??? oxyCODONE-acetaminophen (PERCOCET) 5-325 mg per tablet 2 Tab  2 Tab Oral Q4H PRN Marylyn Ishihara, MD   2 Tab at 01/28/18 0056       Chart and notes reviewed. Data reviewed. I have evaluated and examined the patient.        IMPRESSION:   ?? 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      PLAN:/DISCUSION:   ?? Robot-assisted laparoscopic cholecystectomy with possible liver biopsy  ?? Consent on chart  ?? Preoperative orders written        Marylyn Ishihara, MD

## 2018-01-28 NOTE — Anesthesia Post-Procedure Evaluation (Signed)
Procedure(s):  ROBOTIC ASSISTED LAPAROSCOPIC CHOLECYSTECTOMY WITH FIREFLY.    general    Anesthesia Post Evaluation      Multimodal analgesia: multimodal analgesia used between 6 hours prior to anesthesia start to PACU discharge  Patient location during evaluation: bedside  Patient participation: complete - patient participated  Level of consciousness: awake  Pain management: adequate  Airway patency: patent  Anesthetic complications: no  Cardiovascular status: stable  Respiratory status: acceptable  Hydration status: acceptable  Post anesthesia nausea and vomiting:  controlled      Vitals Value Taken Time   BP 114/60 01/28/2018 12:14 PM   Temp 36.6 ??C (97.8 ??F) 01/28/2018 12:24 PM   Pulse 68 01/28/2018 12:24 PM   Resp 12 01/28/2018 12:24 PM   SpO2 94 % 01/28/2018 12:24 PM

## 2018-01-28 NOTE — Anesthesia Pre-Procedure Evaluation (Signed)
Relevant Problems   No relevant active problems       Anesthetic History   No history of anesthetic complications            Review of Systems / Medical History  Patient summary reviewed and pertinent labs reviewed    Pulmonary  Within defined limits                 Neuro/Psych     seizures: well controlled         Cardiovascular  Within defined limits                Exercise tolerance: >4 METS     GI/Hepatic/Renal       Hepatitis: type C    Liver disease     Endo/Other  Within defined limits           Other Findings   Comments:                Physical Exam    Airway  Mallampati: II  TM Distance: 4 - 6 cm  Neck ROM: normal range of motion   Mouth opening: Normal     Cardiovascular  Regular rate and rhythm,  S1 and S2 normal,  no murmur, click, rub, or gallop  Rhythm: regular  Rate: normal         Dental    Dentition: Poor dentition  Comments: The patient has very poor dentition.  Loose, broken, and or missing teeth.  I explained the risk of dental damage and or loss.  The patient understands and accepts these risks.     Pulmonary  Breath sounds clear to auscultation               Abdominal  GI exam deferred       Other Findings            Anesthetic Plan    ASA: 2  Anesthesia type: general          Induction: Intravenous  Anesthetic plan and risks discussed with: Patient

## 2018-01-28 NOTE — Other (Signed)
Bedside and Verbal shift change report given by Kathy, RN (offgoing nurse) to Daeja, RN (oncoming nurse). Report included the following information SBAR, Kardex and MAR.

## 2018-01-28 NOTE — Other (Signed)
Bedside and Verbal shift change report given to [facility] ([facility] nurse) by Neomia GlassShantina RN (offgoing nurse). Report included the following information SBAR, Kardex, ED Summary, OR Summary, Procedure Summary, Intake/Output, MAR and Recent Results.

## 2018-01-28 NOTE — Other (Signed)
TRANSFER - IN REPORT:    Verbal report received from Temi, RN(name) on Ian Franklin  being received from [facility]unit) for routine post - op      Report consisted of patient???s Situation, Background, Assessment and   Recommendations(SBAR).     Information from the following report(s) SBAR, OR Summary and MAR was reviewed with the receiving nurse.    Opportunity for questions and clarification was provided.      Assessment completed upon patient???s arrival to unit and care assumed.     Patient and oriented x 4. Patient is resting in bed. Pain assessment completed on arrival.

## 2018-01-28 NOTE — Progress Notes (Signed)
TRANSFER - IN REPORT:    Verbal report received from Daeja RN(name) on Ian Franklin  being received from [facility](unit) for ordered procedure      Report consisted of patient???s Situation, Background, Assessment and   Recommendations(SBAR).     Information from the following report(s) SBAR, Kardex, MAR and Recent Results was reviewed with the receiving nurse.    Opportunity for questions and clarification was provided.      Assessment completed upon patient???s arrival to unit and care assumed.

## 2018-01-28 NOTE — Other (Signed)
Report received Daeja RN. Patient stable. Call bell left in reach.

## 2018-01-28 NOTE — Other (Signed)
TRANSFER - OUT REPORT:    Verbal report given to Daeja RN(name) on Jarrid A Gettinger  being transferred to 5 south(unit) for routine progression of care       Report consisted of patient???s Situation, Background, Assessment and   Recommendations(SBAR).     Information from the following report(s) SBAR, Kardex, OR Summary, Procedure Summary, Intake/Output, MAR, Recent Results and Med Rec Status was reviewed with the receiving nurse.    Lines:   Peripheral IV 01/27/18 Anterior;Proximal;Right Forearm (Active)   Site Assessment Clean, dry, & intact 01/28/2018 11:14 AM   Phlebitis Assessment 0 01/28/2018 11:14 AM   Infiltration Assessment 0 01/28/2018 11:14 AM   Dressing Status Clean, dry, & intact 01/28/2018 11:14 AM   Dressing Type Tape;Transparent 01/28/2018 11:14 AM   Hub Color/Line Status Pink;Infusing 01/28/2018 11:14 AM   Alcohol Cap Used Yes 01/27/2018  9:49 AM       Peripheral IV 01/28/18 Anterior;Left;Proximal Forearm (Active)   Site Assessment Clean, dry, & intact 01/28/2018 11:14 AM   Phlebitis Assessment 0 01/28/2018 11:14 AM   Infiltration Assessment 0 01/28/2018 11:14 AM   Dressing Status Clean, dry, & intact 01/28/2018 11:14 AM   Dressing Type Tape;Transparent 01/28/2018 11:14 AM   Hub Color/Line Status Pink;Capped 01/28/2018 11:14 AM        Opportunity for questions and clarification was provided.      Patient transported with:   [employer]

## 2018-01-28 NOTE — Anesthesia Post-Procedure Evaluation (Signed)
Procedure(s):  ROBOTIC ASSISTED LAPAROSCOPIC CHOLECYSTECTOMY WITH FIREFLY.    general    Anesthesia Post Evaluation      Multimodal analgesia: multimodal analgesia used between 6 hours prior to anesthesia start to PACU discharge  Patient location during evaluation: bedside  Patient participation: complete - patient participated  Level of consciousness: awake  Pain management: adequate  Airway patency: patent  Anesthetic complications: no  Cardiovascular status: stable  Respiratory status: acceptable  Hydration status: acceptable  Post anesthesia nausea and vomiting:  controlled      Vitals Value Taken Time   BP 114/60 01/28/2018 12:14 PM   Temp 36.6 ??C (97.8 ??F) 01/28/2018 12:24 PM   Pulse 68 01/28/2018 12:24 PM   Resp 12 01/28/2018 12:24 PM   SpO2 94 % 01/28/2018 12:24 PM

## 2018-01-28 NOTE — Progress Notes (Signed)
[name], [name]  PROGRESS NOTE    Name: Ian Franklin MRN: [id number]   DOB: 10/25/1985 [facility]   Date: 01/28/2018 Admission Date: 01/25/2018 10:40 AM     Hospital Day: 4  Day of Surgery  Subjective:  Pt without acute overnight events.  Objective:  Vitals:    01/27/18 1846 01/27/18 2105 01/28/18 0531 01/28/18 0908   BP: 118/70 119/70 90/54 134/72   Pulse: 72 60 64 67   Resp: 16 17 17 20    Temp: 98.2 ??F (36.8 ??C) 98 ??F (36.7 ??C) 97.4 ??F (36.3 ??C) 98.4 ??F (36.9 ??C)   SpO2: 100% 97% 95% 97%   Weight:       Height:         Date 01/27/18 0700 - 01/28/18 0659 01/28/18 0700 - 01/29/18 0659   Shift 0700-1859 1900-0659 24 Hour Total 0700-1859 1900-0659 24 Hour Total   INTAKE   P.O. 320 360 680        P.O. 320 360 680      Shift Total(mL/kg) 320(4.3) 360(4.8) 680(9.1)      OUTPUT   Urine(mL/kg/hr) 550(0.6)  550(0.3)        Urine Voided 550  550      Shift Total(mL/kg) 550(7.3)  550(7.3)      NET -230 360 130      Weight (kg) 74.8 74.8 74.8 74.8 74.8 74.8         Physical Exam:    General: Awake and alert, oriented x4, no apparent distress   Abdomen: abdomen is soft with right upper quadrant tenderness.   No masses, organomegaly or guarding    Labs:  Recent Results (from the past 24 hour(s))   DRUG SCREEN, URINE    Collection Time: 01/28/18  8:30 AM   Result Value Ref Range    BENZODIAZEPINES NEGATIVE  NEG      BARBITURATES NEGATIVE  NEG      THC (TH-CANNABINOL) NEGATIVE  NEG      OPIATES POSITIVE (A) NEG      PCP(PHENCYCLIDINE) NEGATIVE  NEG      COCAINE NEGATIVE  NEG      AMPHETAMINES NEGATIVE  NEG      METHADONE NEGATIVE  NEG      HDSCOM (NOTE)      All Micro Results     None          Current Medications:  Current Facility-Administered Medications   Medication Dose Route Frequency Provider Last Rate Last Dose   ??? sodium chloride (NS) flush 5-40 mL  5-40 mL IntraVENous Q8H Miquel DunnMyers, Roderick L, CRNA       ??? sodium chloride (NS) flush 5-40 mL  5-40 mL IntraVENous PRN Miquel DunnMyers, Roderick L, CRNA        ??? lactated Ringers infusion  75 mL/hr IntraVENous CONTINUOUS Miquel DunnMyers, Roderick L, CRNA       ??? cefoTEtan (CEFOTAN) 2 g in sterile water (preservative free) 20 mL IV syringe  2 g IntraVENous ONCE Marylyn IshiharaWilliams, Daneya Hartgrove P, MD       ??? sodium chloride (NS) flush 5-40 mL  5-40 mL IntraVENous Q8H Marylyn IshiharaWilliams, Laquanda Bick P, MD   10 mL at 01/28/18 0602   ??? sodium chloride (NS) flush 5-40 mL  5-40 mL IntraVENous PRN Marylyn IshiharaWilliams, Andranik Jeune P, MD   10 mL at 01/27/18 0542   ??? lactated Ringers infusion  125 mL/hr IntraVENous CONTINUOUS Marylyn IshiharaWilliams, Marvelous Woolford P, MD 125 mL/hr at 01/28/18 0407 125 mL/hr at 01/28/18 0407   ???  HYDROmorphone (DILAUDID) injection 1 mg  1 mg IntraVENous Q4H PRN Marylyn Ishihara, MD   1 mg at 01/28/18 0745   ??? naloxone (NARCAN) injection 0.4 mg  0.4 mg IntraVENous PRN Marylyn Ishihara, MD       ??? ondansetron [facility]) injection 4 mg  4 mg IntraVENous Q4H PRN Marylyn Ishihara, MD       ??? diphenhydrAMINE (BENADRYL) injection 12.5 mg  12.5 mg IntraVENous Q4H PRN Marylyn Ishihara, MD       ??? LORazepam (ATIVAN) injection 1 mg  1 mg IntraVENous Q8H PRN Marylyn Ishihara, MD       ??? heparin (porcine) injection 5,000 Units  5,000 Units SubCUTAneous Q8H Marylyn Ishihara, MD   Stopped at 01/27/18 2353   ??? oxyCODONE-acetaminophen (PERCOCET) 5-325 mg per tablet 2 Tab  2 Tab Oral Q4H PRN Marylyn Ishihara, MD   2 Tab at 01/28/18 0056       Chart and notes reviewed. Data reviewed. I have evaluated and examined the patient.        IMPRESSION:   ?? 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      PLAN:/DISCUSION:   ?? Robot-assisted laparoscopic cholecystectomy with possible liver biopsy  ?? Consent on chart  ?? Preoperative orders written        Marylyn Ishihara, MD

## 2018-01-28 NOTE — Interval H&P Note (Signed)
 TRANSFER - OUT REPORT:    Verbal report given to Daeja RN(name) on Ian Franklin  being transferred to [facility] south(unit) for routine progression of care       Report consisted of patient's Situation, Background, Assessment and   Recommendations(SBAR).     Information from the following report(s) SBAR, Kardex, OR Summary, Procedure Summary, Intake/Output, MAR, Recent Results and Med Rec Status was reviewed with the receiving nurse.    Lines:   Peripheral IV 01/27/18 Anterior;Proximal;Right Forearm (Active)   Site Assessment Clean, dry, & intact 01/28/2018 11:14 AM   Phlebitis Assessment 0 01/28/2018 11:14 AM   Infiltration Assessment 0 01/28/2018 11:14 AM   Dressing Status Clean, dry, & intact 01/28/2018 11:14 AM   Dressing Type Tape;Transparent 01/28/2018 11:14 AM   Hub Color/Line Status Pink;Infusing 01/28/2018 11:14 AM   Alcohol Cap Used Yes 01/27/2018  9:49 AM       Peripheral IV 01/28/18 Anterior;Left;Proximal Forearm (Active)   Site Assessment Clean, dry, & intact 01/28/2018 11:14 AM   Phlebitis Assessment 0 01/28/2018 11:14 AM   Infiltration Assessment 0 01/28/2018 11:14 AM   Dressing Status Clean, dry, & intact 01/28/2018 11:14 AM   Dressing Type Tape;Transparent 01/28/2018 11:14 AM   Hub Color/Line Status Pink;Capped 01/28/2018 11:14 AM        Opportunity for questions and clarification was provided.      Patient transported with:   [employer]

## 2018-01-28 NOTE — Anesthesia Pre-Procedure Evaluation (Signed)
Relevant Problems   No relevant active problems       Anesthetic History   No history of anesthetic complications            Review of Systems / Medical History  Patient summary reviewed and pertinent labs reviewed    Pulmonary  Within defined limits                 Neuro/Psych     seizures: well controlled         Cardiovascular  Within defined limits                Exercise tolerance: >4 METS     GI/Hepatic/Renal       Hepatitis: type C    Liver disease     Endo/Other  Within defined limits           Other Findings   Comments:                Physical Exam    Airway  Mallampati: II  TM Distance: 4 - 6 cm  Neck ROM: normal range of motion   Mouth opening: Normal     Cardiovascular  Regular rate and rhythm,  S1 and S2 normal,  no murmur, click, rub, or gallop  Rhythm: regular  Rate: normal         Dental    Dentition: Poor dentition  Comments: The patient has very poor dentition.  Loose, broken, and or missing teeth.  I explained the risk of dental damage and or loss.  The patient understands and accepts these risks.     Pulmonary  Breath sounds clear to auscultation               Abdominal  GI exam deferred       Other Findings            Anesthetic Plan    ASA: 2  Anesthesia type: general          Induction: Intravenous  Anesthetic plan and risks discussed with: Patient

## 2018-01-28 NOTE — Op Note (Signed)
[facility]                               [address]                          [place], [place] [id number]                                 OPERATIVE REPORT    PATIENT:    Ian CaroliJohn A Adolph  MRN:           [id number]   DATE:   01/28/2018  BILLING:     [id number]  ROOM:       PH/PL  ATTENDING:   Marylyn Ishiharaharles P Renly Guedes, MD  DICTATING:   Marylyn Ishiharaharles P Starling Christofferson, MD      PREOPERATIVE DIAGNOSIS: Chronic calculus cholecystitis    POSTOPERATIVE DIAGNOSIS: Same    PROCEDURES PERFORMED: Robotic assisted laparoscopic cholecystectomy    SURGEON: Thad Rangerharles Rooney Gladwin, MD    ASSISTANT: Toribio HarbourMonica Jones, SA    ANESTHESIA: General and local 0.25% Marcaine with epinephrine.    FINDINGS: chronic calculus cholecystectomy    SPECIMENS REMOVED: Gallbladder.    ESTIMATED BLOOD LOSS: 10 mL    FLUIDS:  1000 mL    OPERATIVE INDICATION: Chronic calculus cholecystitis    DESCRIPTION OF PROCEDURE: The patient was identified in the holding area, where consent for laparoscopic, possible open, cholecystectomy was verified. Once in the operating room, the patient was placed supine and general anesthesia was induced. The abdomen was prepped and draped in sterile fashion using chlorhexidine solution and sterile drapes. Patient had 3 mg of indocyanine green injected in the holding area.  The robotic and laparoscopic equipment were assembled and proper functioning was visualized prior to making the initial incision. The local anesthetic was infiltrated into the skin and deep dermal tissues at each incision prior to the incision being made.    The initial trocar was placed at the umbilicus using the optiview technique.  The 5 mm 0 degree scope was assembled to the 8 mm blunt tipped trocar.  Safe entry into the peritoneal cavity was visualized.  The insufflation was obtained using carbon dioxide gas to 15 mmHg. The 8 mm blunt tipped trocar was then placed in the left upper quadrant.  An 8 mm trocar in the right lower quadrant.  A  5 mm trocar was placed in the right subcostal position.   The patient was placed in reverse Trendelenburg with the right side up.  The surgical cart was guided into position and the trocars were docked.     The liver had no evidence of cirrhosis therefore no liver biopsy was necessary.  The fundus of the gallbladder was identified and retracted anterior,  cephalad, and lateral.  Blunt dissection was employed to visualize the cystic duct infundibulum junction. Firefly was used visualize the cystic duct and common bile duct. The cystic duct was dissected free circumferentially and clipped and ligated in standard fashion. The cystic artery was identified and clipped and ligated in standard fashion. The gallbladder was removed from the gallbladder fossa using electrocautery for both hemostasis and dissection. The gallbladder was removed from the peritoneal cavity at the umbilical trocar. Trocar was then reinserted. The area of the clips was inspected and found to be free of  any bleeding or bile leak.  The pneumoperitoneum  was allowed to deflate.  4-0 Monocryl suture was used to close the incision sites in interrupted stitches.  Mastisol, Steri-Strips and Band-Aids were used to create sterile dressings.. The patient tolerated the procedure very well.    DISPOSITION: He was extubated and stable upon transport to the recovery  room.      Lia Hopping, MD, FACS

## 2018-01-28 NOTE — Progress Notes (Signed)
 TRANSFER - IN REPORT:    Verbal report received from Daeja RN(name) on Ian Franklin  being received from [facility](unit) for ordered procedure      Report consisted of patient's Situation, Background, Assessment and   Recommendations(SBAR).     Information from the following report(s) SBAR, Kardex, [facility] and Recent Results was reviewed with the receiving nurse.    Opportunity for questions and clarification was provided.      Assessment completed upon patient's arrival to unit and care assumed.

## 2018-01-29 LAB — HEPATITIS C GENOTYPE

## 2018-01-29 LAB — HEPATITIS C QT BY PCR WITH REFLEX GENOTYPE
HCV LOG10, 550051: 4.889 log10 IU/mL
HCV log10: 4.889 log10 IU/mL
HEPATITIS C QUANTITATION, 550036: 77500 IU/mL
Hepatitis C Quantitation: 77500 IU/mL

## 2018-01-29 MED ORDER — IBUPROFEN 800 MG TAB
800 mg | ORAL_TABLET | Freq: Three times a day (TID) | ORAL | 0 refills | Status: AC | PRN
Start: 2018-01-29 — End: ?

## 2018-01-29 MED ORDER — HYDROMORPHONE 4 MG TAB
4 mg | ORAL_TABLET | Freq: Four times a day (QID) | ORAL | 0 refills | Status: AC | PRN
Start: 2018-01-29 — End: 2018-02-05

## 2018-01-29 MED ORDER — DOCUSATE SODIUM 50 MG CAP
50 mg | ORAL_CAPSULE | Freq: Two times a day (BID) | ORAL | 2 refills | Status: AC
Start: 2018-01-29 — End: 2018-04-29

## 2018-01-29 MED FILL — WATER FOR INJECTION, STERILE INJECTION: INTRAMUSCULAR | Qty: 20

## 2018-01-29 MED FILL — KETOROLAC TROMETHAMINE 15 MG/ML INJECTION: 15 mg/mL | INTRAMUSCULAR | Qty: 1

## 2018-01-29 MED FILL — HEPARIN (PORCINE) 5,000 UNIT/ML IJ SOLN: 5000 unit/mL | INTRAMUSCULAR | Qty: 1

## 2018-01-29 MED FILL — OXYCODONE-ACETAMINOPHEN 5 MG-325 MG TAB: 5-325 mg | ORAL | Qty: 2

## 2018-01-29 MED FILL — HYDROMORPHONE 1 MG/ML INJECTION SOLUTION: 1 mg/mL | INTRAMUSCULAR | Qty: 1

## 2018-01-29 MED FILL — CEFOTAN 2 GRAM SOLUTION FOR INJECTION: 2 gram | INTRAMUSCULAR | Qty: 2

## 2018-01-29 NOTE — Discharge Summary (Signed)
[facility]  Marylyn Ishiharaharles P Oretta Berkland, MD, [facility]  General Surgery  Discharge Summary     Patient ID:  Ian CaroliJohn A Spengler  [id number]  32 y.o.  08/16/1985    Admit Date: 01/25/2018    Discharge Date: 01/29/2018    Admission Diagnoses: Cholecystitis [K81.9];Acute cholecystitis [K81.0]    Discharge Diagnoses:    Problem List as of 01/29/2018 Date Reviewed: 01/28/2018          Codes Class Noted - Resolved    Cholecystitis ICD-10-CM: K81.9  ICD-9-CM: 575.10  01/25/2018 - Present        Acute cholecystitis ICD-10-CM: K81.0  ICD-9-CM: 575.0  01/25/2018 - Present               Admission Condition: Fair    Discharge Condition: Good    Last Procedure: Procedure(s):  ROBOTIC ASSISTED LAPAROSCOPIC [facility]    Hospital Course:   Normal hospital course for this procedure.    Consults: None    Significant Diagnostic Studies: HIDA    Disposition: home    Patient Instructions:   Current Discharge Medication List      START taking these medications    Details   HYDROmorphone (DILAUDID) 4 mg tablet Take 1 Tab by mouth every six (6) hours as needed for Pain for up to 7 days. Max Daily Amount: 16 mg. Indications: severe pain  Qty: 20 Tab, Refills: 0    Associated Diagnoses: Postoperative pain      docusate sodium (COLACE) 50 mg capsule Take 1 Cap by mouth two (2) times a day for 90 days.  Qty: 60 Cap, Refills: 2      ibuprofen (MOTRIN) 800 mg tablet Take 1 Tab by mouth three (3) times daily as needed for Pain.  Qty: 40 Tab, Refills: 0         CONTINUE these medications which have NOT CHANGED    Details   ARIPiprazole (ABILIFY) 10 mg tablet Take 10 mg by mouth daily.      citalopram (CELEXA) 20 mg tablet Take 20 mg by mouth daily.      gabapentin (NEURONTIN) 300 mg capsule Take 300 mg by mouth three (3) times daily.      OLANZapine (ZYPREXA) 10 mg tablet Take 10 mg by mouth nightly.      clonazePAM (KLONOPIN) 0.5 mg tablet Take 0.5 mg by mouth two (2) times a day.       ondansetron hcl (ZOFRAN) 4 mg tablet Take 1 Tab by mouth every eight (8) hours as needed for Nausea.  Qty: 12 Tab, Refills: 0           Activity: See surgical instructions  Diet: Low fat, Low cholesterol  Wound Care: Ice to area for comfort and As directed    Follow-up with Dr. Mayford KnifeWilliams in 2 weeks.  Follow-up tests/labs None    Signed:  Marylyn Ishiharaharles P Brenden Rudman, MD  01/29/2018  6:53 AM

## 2018-01-29 NOTE — Progress Notes (Signed)
Ian Franklin P. Ian Franklin, [name]  PROGRESS NOTE    Name: Ian CaroliJohn A Franklin MRN: [id number]   DOB: 09/06/1985 [facility]   Date: 01/29/2018 Admission Date: 01/25/2018 10:40 AM     Hospital Day: 5  1 Day Post-Op  Subjective:  Pt without acute overnight events.   Objective:  Vitals:    01/28/18 1839 01/28/18 2142 01/29/18 0210 01/29/18 0527   BP: 115/62 116/65 100/50 124/71   Pulse: 96 83 85 65   Resp: 17 12 12 16    Temp: 98.6 ??F (37 ??C) 97 ??F (36.1 ??C) 97.4 ??F (36.3 ??C) 97.6 ??F (36.4 ??C)   SpO2: 96% 96% 96% 98%   Weight:       Height:         Date 01/28/18 0700 - 01/29/18 0659 01/29/18 0700 - 01/30/18 0659   Shift 0700-1859 1900-0659 24 Hour Total 0700-1859 1900-0659 24 Hour Total   INTAKE   I.V.(mL/kg/hr) 1100(1.2)  1100(0.6)        Volume (lactated Ringers infusion) 1100  1100      Shift Total(mL/kg) 1100(14.7)  1100(14.7)      OUTPUT   Urine(mL/kg/hr) 200(0.2)  200(0.1)        Urine Voided 200  200        Urine Occurrence(s) 0 x  0 x      Stool           Stool Occurrence(s) 0 x  0 x      Blood 10  10        Estimated Blood Loss 10  10      Shift Total(mL/kg) 210(2.8)  210(2.8)      NET 890  890      Weight (kg) 74.8 74.8 74.8 74.8 74.8 74.8         Physical Exam:    General: A&A, Ox4, NAD   Abdomen: abdomen is soft with incisional tenderness.  Incision(s) are C/D/I.  No masses, organomegaly or guarding    Labs:  Recent Results (from the past 24 hour(s))   DRUG SCREEN, URINE    Collection Time: 01/28/18  8:30 AM   Result Value Ref Range    BENZODIAZEPINES NEGATIVE  NEG      BARBITURATES NEGATIVE  NEG      THC (TH-CANNABINOL) NEGATIVE  NEG      OPIATES POSITIVE (A) NEG      PCP(PHENCYCLIDINE) NEGATIVE  NEG      COCAINE NEGATIVE  NEG      AMPHETAMINES NEGATIVE  NEG      METHADONE NEGATIVE  NEG      HDSCOM (NOTE)    CBC W/O DIFF    Collection Time: 01/28/18  5:15 PM   Result Value Ref Range    WBC 8.0 4.6 - 13.2 K/uL    RBC 4.59 (L) 4.70 - 5.50 M/uL    HGB 13.3 13.0 - 16.0 g/dL     HCT 40.940.2 81.136.0 - 91.448.0 %    MCV 87.6 74.0 - 97.0 FL    MCH 29.0 24.0 - 34.0 PG    MCHC 33.1 31.0 - 37.0 g/dL    RDW 78.215.8 (H) 95.611.6 - 14.5 %    PLATELET 214 135 - 420 K/uL    MPV 10.7 9.2 - 11.8 FL     All Micro Results     None          Current Medications:  Current Facility-Administered Medications   Medication Dose Route Frequency Provider  Last Rate Last Dose   ??? ketorolac (TORADOL) injection 15 mg  15 mg IntraVENous Q8H Marylyn Ishihara, MD   15 mg at 01/29/18 9147   ??? sodium chloride (NS) flush 5-40 mL  5-40 mL IntraVENous Q8H Marylyn Ishihara, MD   10 mL at 01/29/18 0709   ??? sodium chloride (NS) flush 5-40 mL  5-40 mL IntraVENous PRN Marylyn Ishihara, MD   10 mL at 01/27/18 0542   ??? lactated Ringers infusion  125 mL/hr IntraVENous CONTINUOUS Marylyn Ishihara, MD 125 mL/hr at 01/28/18 1406 125 mL/hr at 01/28/18 1406   ??? HYDROmorphone (DILAUDID) injection 1 mg  1 mg IntraVENous Q4H PRN Marylyn Ishihara, MD   1 mg at 01/28/18 2149   ??? naloxone (NARCAN) injection 0.4 mg  0.4 mg IntraVENous PRN Marylyn Ishihara, MD       ??? ondansetron [facility]) injection 4 mg  4 mg IntraVENous Q4H PRN Marylyn Ishihara, MD       ??? diphenhydrAMINE (BENADRYL) injection 12.5 mg  12.5 mg IntraVENous Q4H PRN Marylyn Ishihara, MD   12.5 mg at 01/28/18 1121   ??? LORazepam (ATIVAN) injection 1 mg  1 mg IntraVENous Q8H PRN Marylyn Ishihara, MD       ??? heparin (porcine) injection 5,000 Units  5,000 Units SubCUTAneous Q8H Marylyn Ishihara, MD   5,000 Units at 01/28/18 2254   ??? oxyCODONE-acetaminophen (PERCOCET) 5-325 mg per tablet 2 Tab  2 Tab Oral Q4H PRN Marylyn Ishihara, MD   2 Tab at 01/29/18 0522       Chart and notes reviewed. Data reviewed. I have evaluated and examined the patient.        IMPRESSION:   ?? Pt is POD 1 from robot assisted lap chole.      PLAN:/DISCUSION:   ?? D/C home  ?? F/u in 2 weeks        Marylyn Ishihara, MD

## 2018-01-29 NOTE — Progress Notes (Signed)
Problem: General Medical Care Plan  Goal: *Vital signs within specified parameters  Outcome: Progressing Towards Goal  Goal: *Labs within defined limits  Outcome: Progressing Towards Goal  Goal: *Absence of infection signs and symptoms  Outcome: Progressing Towards Goal  Goal: *Optimal pain control at patient's stated goal  Outcome: Progressing Towards Goal  Goal: *Skin integrity maintained  Outcome: Progressing Towards Goal  Goal: *Fluid volume balance  Outcome: Progressing Towards Goal  Goal: *Optimize nutritional status  Outcome: Progressing Towards Goal  Goal: *Anxiety reduced or absent  Outcome: Progressing Towards Goal  Goal: *Progressive mobility and function (eg: ADL's)  Outcome: Progressing Towards Goal

## 2018-01-29 NOTE — Progress Notes (Signed)
Discharge instructions and RX sent to out patients pharmacy given to patients, patients verbalized understanding.

## 2018-01-29 NOTE — Progress Notes (Signed)
D/c noted for today, pt will return to [facility], they will provide transport home and to follow up appointments.       Kaylee Watson, MSW  Case Management  [phone]

## 2018-01-29 NOTE — Progress Notes (Signed)
Discharge instructions and RX sent to out patients pharmacy given to patients, patients verbalized understanding.

## 2018-01-29 NOTE — Progress Notes (Signed)
Keyshawna Prouse P. Mayford KnifeWilliams, [name]  PROGRESS NOTE    Name: Ian CaroliJohn A Franklin MRN: [id number]   DOB: 11/10/1985 [facility]   Date: 01/29/2018 Admission Date: 01/25/2018 10:40 AM     Hospital Day: 5  1 Day Post-Op  Subjective:  Pt without acute overnight events.   Objective:  Vitals:    01/28/18 1839 01/28/18 2142 01/29/18 0210 01/29/18 0527   BP: 115/62 116/65 100/50 124/71   Pulse: 96 83 85 65   Resp: 17 12 12 16    Temp: 98.6 ??F (37 ??C) 97 ??F (36.1 ??C) 97.4 ??F (36.3 ??C) 97.6 ??F (36.4 ??C)   SpO2: 96% 96% 96% 98%   Weight:       Height:         Date 01/28/18 0700 - 01/29/18 0659 01/29/18 0700 - 01/30/18 0659   Shift 0700-1859 1900-0659 24 Hour Total 0700-1859 1900-0659 24 Hour Total   INTAKE   I.V.(mL/kg/hr) 1100(1.2)  1100(0.6)        Volume (lactated Ringers infusion) 1100  1100      Shift Total(mL/kg) 1100(14.7)  1100(14.7)      OUTPUT   Urine(mL/kg/hr) 200(0.2)  200(0.1)        Urine Voided 200  200        Urine Occurrence(s) 0 x  0 x      Stool           Stool Occurrence(s) 0 x  0 x      Blood 10  10        Estimated Blood Loss 10  10      Shift Total(mL/kg) 210(2.8)  210(2.8)      NET 890  890      Weight (kg) 74.8 74.8 74.8 74.8 74.8 74.8         Physical Exam:    General: A&A, Ox4, NAD   Abdomen: abdomen is soft with incisional tenderness.  Incision(s) are C/D/I.  No masses, organomegaly or guarding    Labs:  Recent Results (from the past 24 hour(s))   DRUG SCREEN, URINE    Collection Time: 01/28/18  8:30 AM   Result Value Ref Range    BENZODIAZEPINES NEGATIVE  NEG      BARBITURATES NEGATIVE  NEG      THC (TH-CANNABINOL) NEGATIVE  NEG      OPIATES POSITIVE (A) NEG      PCP(PHENCYCLIDINE) NEGATIVE  NEG      COCAINE NEGATIVE  NEG      AMPHETAMINES NEGATIVE  NEG      METHADONE NEGATIVE  NEG      HDSCOM (NOTE)    CBC W/O DIFF    Collection Time: 01/28/18  5:15 PM   Result Value Ref Range    WBC 8.0 4.6 - 13.2 K/uL    RBC 4.59 (L) 4.70 - 5.50 M/uL    HGB 13.3 13.0 - 16.0 g/dL    HCT 47.840.2 29.536.0 - 62.148.0 %     MCV 87.6 74.0 - 97.0 FL    MCH 29.0 24.0 - 34.0 PG    MCHC 33.1 31.0 - 37.0 g/dL    RDW 30.815.8 (H) 65.711.6 - 14.5 %    PLATELET 214 135 - 420 K/uL    MPV 10.7 9.2 - 11.8 FL     All Micro Results     None          Current Medications:  Current Facility-Administered Medications   Medication Dose Route Frequency Provider  Last Rate Last Dose   ??? ketorolac (TORADOL) injection 15 mg  15 mg IntraVENous Q8H Marylyn Ishihara, MD   15 mg at 01/29/18 0981   ??? sodium chloride (NS) flush 5-40 mL  5-40 mL IntraVENous Q8H Marylyn Ishihara, MD   10 mL at 01/29/18 0709   ??? sodium chloride (NS) flush 5-40 mL  5-40 mL IntraVENous PRN Marylyn Ishihara, MD   10 mL at 01/27/18 0542   ??? lactated Ringers infusion  125 mL/hr IntraVENous CONTINUOUS Marylyn Ishihara, MD 125 mL/hr at 01/28/18 1406 125 mL/hr at 01/28/18 1406   ??? HYDROmorphone (DILAUDID) injection 1 mg  1 mg IntraVENous Q4H PRN Marylyn Ishihara, MD   1 mg at 01/28/18 2149   ??? naloxone (NARCAN) injection 0.4 mg  0.4 mg IntraVENous PRN Marylyn Ishihara, MD       ??? ondansetron [facility]) injection 4 mg  4 mg IntraVENous Q4H PRN Marylyn Ishihara, MD       ??? diphenhydrAMINE (BENADRYL) injection 12.5 mg  12.5 mg IntraVENous Q4H PRN Marylyn Ishihara, MD   12.5 mg at 01/28/18 1121   ??? LORazepam (ATIVAN) injection 1 mg  1 mg IntraVENous Q8H PRN Marylyn Ishihara, MD       ??? heparin (porcine) injection 5,000 Units  5,000 Units SubCUTAneous Q8H Marylyn Ishihara, MD   5,000 Units at 01/28/18 2254   ??? oxyCODONE-acetaminophen (PERCOCET) 5-325 mg per tablet 2 Tab  2 Tab Oral Q4H PRN Marylyn Ishihara, MD   2 Tab at 01/29/18 0522       Chart and notes reviewed. Data reviewed. I have evaluated and examined the patient.        IMPRESSION:   ?? Pt is POD 1 from robot assisted lap chole.      PLAN:/DISCUSION:   ?? D/C home  ?? F/u in 2 weeks        Marylyn Ishihara, MD

## 2018-01-29 NOTE — Progress Notes (Signed)
D/c noted for today[facility] home and to follow up appointments.       Ozella RocksKaylee Watson, MSW  Case Management  [phone]

## 2018-01-29 NOTE — Discharge Summary (Signed)
[facility]  Ian Ishiharaharles P Elian Gloster, MD, [facility]  General Surgery  Discharge Summary     Patient ID:  Ian Franklin  [id number]  32 y.o.  09/20/1985    Admit Date: 01/25/2018    Discharge Date: 01/29/2018    Admission Diagnoses: Cholecystitis [K81.9];Acute cholecystitis [K81.0]    Discharge Diagnoses:    Problem List as of 01/29/2018 Date Reviewed: 01/28/2018          Codes Class Noted - Resolved    Cholecystitis ICD-10-CM: K81.9  ICD-9-CM: 575.10  01/25/2018 - Present        Acute cholecystitis ICD-10-CM: K81.0  ICD-9-CM: 575.0  01/25/2018 - Present               Admission Condition: Fair    Discharge Condition: Good    Last Procedure: Procedure(s):  ROBOTIC ASSISTED [facility]COPIC [facility]    Hospital Course:   Normal hospital course for this procedure.    Consults: None    Significant Diagnostic Studies: HIDA    Disposition: home    Patient Instructions:   Current Discharge Medication List      START taking these medications    Details   HYDROmorphone (DILAUDID) 4 mg tablet Take 1 Tab by mouth every six (6) hours as needed for Pain for up to 7 days. Max Daily Amount: 16 mg. Indications: severe pain  Qty: 20 Tab, Refills: 0    Associated Diagnoses: Postoperative pain      docusate sodium (COLACE) 50 mg capsule Take 1 Cap by mouth two (2) times a day for 90 days.  Qty: 60 Cap, Refills: 2      ibuprofen (MOTRIN) 800 mg tablet Take 1 Tab by mouth three (3) times daily as needed for Pain.  Qty: 40 Tab, Refills: 0         CONTINUE these medications which have NOT CHANGED    Details   ARIPiprazole (ABILIFY) 10 mg tablet Take 10 mg by mouth daily.      citalopram (CELEXA) 20 mg tablet Take 20 mg by mouth daily.      gabapentin (NEURONTIN) 300 mg capsule Take 300 mg by mouth three (3) times daily.      OLANZapine (ZYPREXA) 10 mg tablet Take 10 mg by mouth nightly.      clonazePAM (KLONOPIN) 0.5 mg tablet Take 0.5 mg by mouth two (2) times a day.      ondansetron hcl (ZOFRAN) 4 mg tablet Take 1  Tab by mouth every eight (8) hours as needed for Nausea.  Qty: 12 Tab, Refills: 0           Activity: See surgical instructions  Diet: Low fat, Low cholesterol  Wound Care: Ice to area for comfort and As directed    Follow-up with Dr. Mayford KnifeWilliams in 2 weeks.  Follow-up tests/labs None    Signed:  Marylyn Ishiharaharles P Ian Riano, MD  01/29/2018  6:53 AM

## 2018-01-29 NOTE — Progress Notes (Signed)
Problem: General Medical Care Plan  Goal: *Vital signs within specified parameters  Outcome: Progressing Towards Goal  Goal: *Labs within defined limits  Outcome: Progressing Towards Goal  Goal: *Absence of infection signs and symptoms  Outcome: Progressing Towards Goal  Goal: *Optimal pain control at patient's stated goal  Outcome: Progressing Towards Goal  Goal: *Skin integrity maintained  Outcome: Progressing Towards Goal  Goal: *Fluid volume balance  Outcome: Progressing Towards Goal  Goal: *Optimize nutritional status  Outcome: Progressing Towards Goal  Goal: *Anxiety reduced or absent  Outcome: Progressing Towards Goal  Goal: *Progressive mobility and function (eg: ADL's)  Outcome: Progressing Towards Goal

## 2018-01-30 ENCOUNTER — Emergency Department: Payer: MEDICAID

## 2018-01-30 DIAGNOSIS — G8918 Other acute postprocedural pain: Secondary | ICD-10-CM

## 2018-01-30 NOTE — ED Triage Notes (Signed)
Per EMS, patient was here 2 days ago for gall bladder removal ,pt states pain meds not working , complaints of abdominal pain

## 2018-01-30 NOTE — ED Provider Notes (Signed)
EMERGENCY DEPARTMENT HISTORY AND PHYSICAL EXAM    Date: 01/30/2018  Patient Name: Ian Franklin    History of Presenting Illness     Chief Complaint   Patient presents with   ??? Abdominal Pain         History Provided By: Patient    Additional History (Context): Ian Franklin is a 32 y.o. male with Psych, substance abuse who presents with generalized abdominal pain shortness of breath and nausea.  Pain is not controlled by his medications given postoperatively from his cholecystectomy performed by Dr. Jimmye Norman 2 days ago.  Patient said any of the symptoms that he is mentioned were supposed to bring him back to the emergency department for reevaluation.  Last dose of pain medication at 730.    PCP: None    Current Facility-Administered Medications   Medication Dose Route Frequency Provider Last Rate Last Dose   ??? morphine injection 4 mg  4 mg IntraVENous NOW Orlo Brickle L, PA       ??? 0.9% sodium chloride infusion  100 mL/hr IntraVENous CONTINUOUS Jeanclaude Wentworth L, PA         Current Outpatient Medications   Medication Sig Dispense Refill   ??? HYDROmorphone (DILAUDID) 4 mg tablet Take 1 Tab by mouth every six (6) hours as needed for Pain for up to 7 days. Max Daily Amount: 16 mg. Indications: severe pain 20 Tab 0   ??? docusate sodium (COLACE) 50 mg capsule Take 1 Cap by mouth two (2) times a day for 90 days. 60 Cap 2   ??? ibuprofen (MOTRIN) 800 mg tablet Take 1 Tab by mouth three (3) times daily as needed for Pain. 40 Tab 0   ??? ARIPiprazole (ABILIFY) 10 mg tablet Take 10 mg by mouth daily.     ??? citalopram (CELEXA) 20 mg tablet Take 20 mg by mouth daily.     ??? gabapentin (NEURONTIN) 300 mg capsule Take 300 mg by mouth three (3) times daily.     ??? OLANZapine (ZYPREXA) 10 mg tablet Take 10 mg by mouth nightly.     ??? clonazePAM (KLONOPIN) 0.5 mg tablet Take 0.5 mg by mouth two (2) times a day.     ??? ondansetron hcl (ZOFRAN) 4 mg tablet Take 1 Tab by mouth every eight (8) hours as needed for Nausea. 12 Tab 0        Past History     Past Medical History:  Past Medical History:   Diagnosis Date   ??? Hepatitis C infection        Past Surgical History:  Past Surgical History:   Procedure Laterality Date   ??? LAP,CHOLECYSTECTOMY N/A 01/28/2018    Dr. Jimmye Norman       Family History:  No family history on file.    Social History:  Social History     Tobacco Use   ??? Smoking status: Current Every Day Smoker     Packs/day: 0.25   ??? Smokeless tobacco: Never Used   Substance Use Topics   ??? Alcohol use: Yes   ??? Drug use: Never       Allergies:  Allergies   Allergen Reactions   ??? Naratriptan Seizures         Review of Systems   Review of Systems   Constitutional: Negative for fever.   Respiratory: Positive for shortness of breath.    Gastrointestinal: Positive for abdominal pain and nausea. Negative for vomiting.     All Other Systems  Negative  Physical Exam     Vitals:    01/30/18 2306 01/30/18 2330 01/31/18 0030 01/31/18 0100   BP: 129/79 126/71 121/69 (!) 117/94   Pulse: 92 87 82 82   Resp: '14 15 14 13   ' Temp: 98.3 ??F (36.8 ??C)      SpO2: 100% 98% 98% 98%   Weight: 78.8 kg (173 lb 12.8 oz)      Height: '6\' 6"'  (1.981 m)        Physical Exam   Constitutional: Vital signs are normal. He appears well-developed and well-nourished. He is active.  Non-toxic appearance. He does not appear ill. No distress.   HENT:   Head: Normocephalic and atraumatic.   Neck: Normal range of motion. Neck supple. Carotid bruit is not present. No tracheal deviation present. No thyromegaly present.   Cardiovascular: Normal rate, regular rhythm and normal heart sounds. Exam reveals no gallop and no friction rub.   No murmur heard.  Pulmonary/Chest: Effort normal and breath sounds normal. No stridor. No respiratory distress. He has no wheezes. He has no rales. He exhibits no tenderness.   Abdominal: Soft. He exhibits no distension and no mass. There is tenderness. There is guarding. There is no rebound and no CVA tenderness.    Extreme tenderness to palpation with light touch   Musculoskeletal: Normal range of motion.   Neurological: He is alert.   Skin: Skin is warm, dry and intact. He is not diaphoretic. No pallor.   Psychiatric: He has a normal mood and affect. His speech is normal and behavior is normal. Judgment and thought content normal.   Nursing note and vitals reviewed.           Diagnostic Study Results     Labs -     Recent Results (from the past 12 hour(s))   METABOLIC PANEL, COMPREHENSIVE    Collection Time: 01/31/18 12:34 AM   Result Value Ref Range    Sodium 141 136 - 145 mmol/L    Potassium 4.1 3.5 - 5.5 mmol/L    Chloride 110 100 - 111 mmol/L    CO2 27 21 - 32 mmol/L    Anion gap 4 3.0 - 18 mmol/L    Glucose 102 (H) 74 - 99 mg/dL    BUN 9 7.0 - 18 MG/DL    Creatinine 1.00 0.6 - 1.3 MG/DL    BUN/Creatinine ratio 9 (L) 12 - 20      GFR est AA >60 >60 ml/min/1.80m    GFR est non-AA >60 >60 ml/min/1.76m   Calcium 8.8 8.5 - 10.1 MG/DL    Bilirubin, total 0.2 0.2 - 1.0 MG/DL    ALT (SGPT) 131 (H) 16 - 61 U/L    AST (SGOT) 55 (H) 10 - 38 U/L    Alk. phosphatase 166 (H) 45 - 117 U/L    Protein, total 6.9 6.4 - 8.2 g/dL    Albumin 3.4 3.4 - 5.0 g/dL    Globulin 3.5 2.0 - 4.0 g/dL    A-G Ratio 1.0 0.8 - 1.7     LIPASE    Collection Time: 01/31/18 12:34 AM   Result Value Ref Range    Lipase 88 73 - 393 U/L   CBC WITH AUTOMATED DIFF    Collection Time: 01/31/18 12:34 AM   Result Value Ref Range    WBC 9.9 4.6 - 13.2 K/uL    RBC 4.52 (L) 4.70 - 5.50 M/uL    HGB 13.2 13.0 -  16.0 g/dL    HCT 39.3 36.0 - 48.0 %    MCV 86.9 74.0 - 97.0 FL    MCH 29.2 24.0 - 34.0 PG    MCHC 33.6 31.0 - 37.0 g/dL    RDW 16.1 (H) 11.6 - 14.5 %    PLATELET 248 135 - 420 K/uL    MPV 10.5 9.2 - 11.8 FL    NEUTROPHILS 54 40 - 73 %    LYMPHOCYTES 32 21 - 52 %    MONOCYTES 10 3 - 10 %    EOSINOPHILS 4 0 - 5 %    BASOPHILS 0 0 - 2 %    ABS. NEUTROPHILS 5.4 1.8 - 8.0 K/UL    ABS. LYMPHOCYTES 3.2 0.9 - 3.6 K/UL    ABS. MONOCYTES 1.0 0.05 - 1.2 K/UL     ABS. EOSINOPHILS 0.4 0.0 - 0.4 K/UL    ABS. BASOPHILS 0.0 0.0 - 0.1 K/UL    DF AUTOMATED     URINALYSIS W/ RFLX MICROSCOPIC    Collection Time: 01/31/18 12:34 AM   Result Value Ref Range    Color YELLOW      Appearance CLEAR      Specific gravity 1.011 1.005 - 1.030      pH (UA) 8.5 (H) 5.0 - 8.0      Protein NEGATIVE  NEG mg/dL    Glucose NEGATIVE  NEG mg/dL    Ketone NEGATIVE  NEG mg/dL    Bilirubin NEGATIVE  NEG      Blood NEGATIVE  NEG      Urobilinogen 0.2 0.2 - 1.0 EU/dL    Nitrites NEGATIVE  NEG      Leukocyte Esterase NEGATIVE  NEG         Radiologic Studies -   CT ABD PELV W CONT   Final Result   IMPRESSION:      Status post cholecystectomy with minimal stranding in the gallbladder fossa   without definable fluid collection. Small amounts of scattered free   intraperitoneal air. These are all expected postoperative findings. Any concern   for infection or reformations based on clinical setting.      XR CHEST PORT    (Results Pending)     CT Results  (Last 48 hours)               01/31/18 0143  CT ABD PELV W CONT Final result    Impression:  IMPRESSION:       Status post cholecystectomy with minimal stranding in the gallbladder fossa   without definable fluid collection. Small amounts of scattered free   intraperitoneal air. These are all expected postoperative findings. Any concern   for infection or reformations based on clinical setting.       Narrative:  EXAMINATION: CT abdomen/pelvis with IV contrast       INDICATION: Postoperative infection, recent cholecystectomy       COMPARISON: None       TECHNIQUE: CT of the abdomen and pelvis performed following 100 cc IV Isovue-300   with multiplanar reformations. All CT scans at this facility are performed using   dose optimization technique as appropriate to a performed exam, to include   automated exposure control, adjustment of the mA and/or kV according to patient   size (including appropriate matching first site specific examinations), or use    of iterative reconstruction technique.       FINDINGS:       Lower thorax: Notable dependent atelectasis.  Hepatobiliary: Liver normal. Status post cholecystectomy with minimal stranding   in the gallbladder fossa. No biliary duct dilatation.       Pancreas: Normal.       Spleen: Normal.       Adrenal glands: Normal.       Genitourinary: Right kidney normal. Left kidney normal. Bladder unremarkable.   Prostate unremarkable.       Gastrointestinal: Stomach unremarkable. Small bowel loops are nondilated. The   colon is nondilated. Appendix normal.       Mesentery/vessels/nodes: Multiple small foci of free intraperitoneal air. No   free fluid. Major vessels unremarkable. No adenopathy by size criteria.   Superficial soft tissues unremarkable.       Bones: No acute osseous findings.               CXR Results  (Last 48 hours)    None            Medical Decision Making   I am the first provider for this patient.    I reviewed the vital signs, available nursing notes, past medical history, past surgical history, family history and social history.    Vital Signs-Reviewed the patient's vital signs.    Records Reviewed: Nursing Notes and Old Medical Records    Procedures:  Procedures    Provider Notes (Medical Decision Making): Nothing acute on the CT scan possibly some developing atelectasis in his right lower lobe.  We will have him use incentive spirometer and give him 1 more dose of medications.  Patient says that he is a high tolerance for pain medication and is asking to be "pumped full of something so he can just be knocked out when he gets back to the center".      MED RECONCILIATION:  Current Facility-Administered Medications   Medication Dose Route Frequency   ??? morphine injection 4 mg  4 mg IntraVENous NOW   ??? 0.9% sodium chloride infusion  100 mL/hr IntraVENous CONTINUOUS     Current Outpatient Medications   Medication Sig   ??? HYDROmorphone (DILAUDID) 4 mg tablet Take 1 Tab by mouth every six (6)  hours as needed for Pain for up to 7 days. Max Daily Amount: 16 mg. Indications: severe pain   ??? docusate sodium (COLACE) 50 mg capsule Take 1 Cap by mouth two (2) times a day for 90 days.   ??? ibuprofen (MOTRIN) 800 mg tablet Take 1 Tab by mouth three (3) times daily as needed for Pain.   ??? ARIPiprazole (ABILIFY) 10 mg tablet Take 10 mg by mouth daily.   ??? citalopram (CELEXA) 20 mg tablet Take 20 mg by mouth daily.   ??? gabapentin (NEURONTIN) 300 mg capsule Take 300 mg by mouth three (3) times daily.   ??? OLANZapine (ZYPREXA) 10 mg tablet Take 10 mg by mouth nightly.   ??? clonazePAM (KLONOPIN) 0.5 mg tablet Take 0.5 mg by mouth two (2) times a day.   ??? ondansetron hcl (ZOFRAN) 4 mg tablet Take 1 Tab by mouth every eight (8) hours as needed for Nausea.       Disposition:  home    DISCHARGE NOTE:   2:45 AM    Pt has been reexamined.  Patient has no new complaints, changes, or physical findings.  Care plan outlined and precautions discussed.  Results of labs, CXR, CT were reviewed with the patient. All medications were reviewed with the patient. All of pt's questions and concerns were addressed. Patient was instructed and  agrees to follow up with general surgery, as well as to return to the ED upon further deterioration. Patient is ready to go home.    Follow-up Information     Follow up With Specialties Details Why Contact Info    Lia Hopping, MD General Surgery Schedule an appointment as soon as possible for a visit in 2 days  [place] [address]  [phone]      [facility] Emergency Medicine  If symptoms worsen return immediately [place]  [phone]          Current Discharge Medication List             Diagnosis     Clinical Impression:   1. Postoperative abdominal pain    2. SOB (shortness of breath)    3. Nausea without vomiting

## 2018-01-30 NOTE — ED Provider Notes (Signed)
ED Provider Notes by Eulis Foster, PA at 01/30/18 2327                Author: Eulis Foster, PA  Service: EMERGENCY  Author Type: Physician Assistant       Filed: 01/31/18 0245  Date of Service: 01/30/18 2327  Status: Attested           Editor: Eulis Foster, PA (Physician Assistant)  Cosigner: Filbert Schilder, MD at 01/31/18 [phone]          Attestation signed by Filbert Schilder, MD at 01/31/18 [phone]          The patient was seen primarily by the APC. I have been available at all times, but did not participate in the patient's care.      Filbert Schilder, MD   January 31, 2018                                    EMERGENCY DEPARTMENT HISTORY AND PHYSICAL EXAM      Date: 01/30/2018   Patient Name: Ian Franklin        History of Presenting Illness          Chief Complaint       Patient presents with        ?  Abdominal Pain              History Provided By: Patient      Additional History (Context): Ian Franklin  is a 32 y.o. male  with Psych, substance abuse who presents with generalized abdominal pain shortness  of breath and nausea.  Pain is not controlled by his medications given postoperatively from his cholecystectomy performed by Dr. Jimmye Norman 2 days ago.  Patient said any of the symptoms that he is mentioned were supposed to bring him back to the emergency  department for reevaluation.  Last dose of pain medication at 730.      PCP: None        Current Facility-Administered Medications             Medication  Dose  Route  Frequency  Provider  Last Rate  Last Dose              ?  morphine injection 4 mg   4 mg  IntraVENous  NOW  Danyal Adorno L, PA                    ?  0.9% sodium chloride infusion   100 mL/hr  IntraVENous  CONTINUOUS  Fontaine Kossman L, PA                Current Outpatient Medications          Medication  Sig  Dispense  Refill           ?  HYDROmorphone (DILAUDID) 4 mg tablet  Take 1 Tab by mouth every six (6) hours as needed for Pain for up to 7 days. Max Daily Amount: 16 mg.  Indications: severe pain  20 Tab  0     ?  docusate sodium (COLACE) 50 mg capsule  Take 1 Cap by mouth two (2) times a day for 90 days.  60 Cap  2     ?  ibuprofen (MOTRIN) 800 mg tablet  Take 1 Tab by mouth three (3) times daily as needed  for Pain.  40 Tab  0     ?  ARIPiprazole (ABILIFY) 10 mg tablet  Take 10 mg by mouth daily.         ?  citalopram (CELEXA) 20 mg tablet  Take 20 mg by mouth daily.         ?  gabapentin (NEURONTIN) 300 mg capsule  Take 300 mg by mouth three (3) times daily.         ?  OLANZapine (ZYPREXA) 10 mg tablet  Take 10 mg by mouth nightly.         ?  clonazePAM (KLONOPIN) 0.5 mg tablet  Take 0.5 mg by mouth two (2) times a day.               ?  ondansetron hcl (ZOFRAN) 4 mg tablet  Take 1 Tab by mouth every eight (8) hours as needed for Nausea.  12 Tab  0             Past History        Past Medical History:     Past Medical History:        Diagnosis  Date         ?  Hepatitis C infection             Past Surgical History:     Past Surgical History:         Procedure  Laterality  Date          ?  LAP,CHOLECYSTECTOMY  N/A  01/28/2018          Dr. Jimmye Norman           Family History:   No family history on file.      Social History:     Social History          Tobacco Use         ?  Smoking status:  Current Every Day Smoker              Packs/day:  0.25         ?  Smokeless tobacco:  Never Used       Substance Use Topics         ?  Alcohol use:  Yes         ?  Drug use:  Never           Allergies:     Allergies        Allergen  Reactions         ?  Naratriptan  Seizures                Review of Systems     Review of Systems    Constitutional: Negative for fever.    Respiratory: Positive for shortness of breath.     Gastrointestinal: Positive for abdominal pain and nausea . Negative for vomiting.       All Other Systems Negative     Physical Exam          Vitals:             01/30/18 2306  01/30/18 2330  01/31/18 0030  01/31/18 0100           BP:  129/79  126/71  121/69  (!) 117/94     Pulse:   92  87  82  82     Resp:  '14  15  14  ' 13  Temp:  98.3 ??F (36.8 ??C)           SpO2:  100%  98%  98%  98%     Weight:  78.8 kg (173 lb 12.8 oz)                 Height:  '6\' 6"'  (1.981 m)              Physical Exam    Constitutional: Vital signs are normal. He appears well-developed and well-nourished. He is active.  Non-toxic appearance. He does not appear ill. No distress.    HENT:    Head: Normocephalic and atraumatic.    Neck: Normal range of motion. Neck supple. Carotid bruit is not present. No tracheal deviation present. No thyromegaly present.    Cardiovascular: Normal rate, regular rhythm and normal heart sounds. Exam reveals no gallop and no friction rub.    No murmur heard.   Pulmonary/Chest: Effort normal and breath sounds normal. No stridor. No respiratory distress. He has no wheezes. He has no rales. He exhibits no tenderness.    Abdominal: Soft. He exhibits no distension and no mass. There is tenderness. There is  guarding. There is no rebound and no CVA tenderness.   Extreme tenderness to palpation with light touch     Musculoskeletal: Normal range of motion.   Neurological: He is alert.    Skin: Skin is warm, dry and intact. He is not diaphoretic. No pallor.   Psychiatric: He has a normal mood and affect. His speech  is normal and behavior is normal. Judgment and thought content normal.    Nursing note and vitals reviewed.                  Diagnostic Study Results        Labs -         Recent Results (from the past 12 hour(s))     METABOLIC PANEL, COMPREHENSIVE          Collection Time: 01/31/18 12:34 AM         Result  Value  Ref Range            Sodium  141  136 - 145 mmol/L       Potassium  4.1  3.5 - 5.5 mmol/L       Chloride  110  100 - 111 mmol/L       CO2  27  21 - 32 mmol/L       Anion gap  4  3.0 - 18 mmol/L       Glucose  102 (H)  74 - 99 mg/dL       BUN  9  7.0 - 18 MG/DL       Creatinine  1.00  0.6 - 1.3 MG/DL       BUN/Creatinine ratio  9 (L)  12 - 20         GFR est AA  >60  >60  ml/min/1.30m       GFR est non-AA  >60  >60 ml/min/1.722m      Calcium  8.8  8.5 - 10.1 MG/DL       Bilirubin, total  0.2  0.2 - 1.0 MG/DL       ALT (SGPT)  131 (H)  16 - 61 U/L       AST (SGOT)  55 (H)  10 - 38 U/L       Alk. phosphatase  166 (H)  45 - 117 U/L       Protein, total  6.9  6.4 - 8.2 g/dL       Albumin  3.4  3.4 - 5.0 g/dL       Globulin  3.5  2.0 - 4.0 g/dL       A-G Ratio  1.0  0.8 - 1.7         LIPASE          Collection Time: 01/31/18 12:34 AM         Result  Value  Ref Range            Lipase  88  73 - 393 U/L       CBC WITH AUTOMATED DIFF          Collection Time: 01/31/18 12:34 AM         Result  Value  Ref Range            WBC  9.9  4.6 - 13.2 K/uL       RBC  4.52 (L)  4.70 - 5.50 M/uL       HGB  13.2  13.0 - 16.0 g/dL       HCT  39.3  36.0 - 48.0 %       MCV  86.9  74.0 - 97.0 FL       MCH  29.2  24.0 - 34.0 PG       MCHC  33.6  31.0 - 37.0 g/dL       RDW  16.1 (H)  11.6 - 14.5 %       PLATELET  248  135 - 420 K/uL       MPV  10.5  9.2 - 11.8 FL       NEUTROPHILS  54  40 - 73 %       LYMPHOCYTES  32  21 - 52 %       MONOCYTES  10  3 - 10 %       EOSINOPHILS  4  0 - 5 %       BASOPHILS  0  0 - 2 %       ABS. NEUTROPHILS  5.4  1.8 - 8.0 K/UL       ABS. LYMPHOCYTES  3.2  0.9 - 3.6 K/UL       ABS. MONOCYTES  1.0  0.05 - 1.2 K/UL       ABS. EOSINOPHILS  0.4  0.0 - 0.4 K/UL       ABS. BASOPHILS  0.0  0.0 - 0.1 K/UL       DF  AUTOMATED          URINALYSIS W/ RFLX MICROSCOPIC          Collection Time: 01/31/18 12:34 AM         Result  Value  Ref Range            Color  YELLOW          Appearance  CLEAR          Specific gravity  1.011  1.005 - 1.030         pH (UA)  8.5 (H)  5.0 - 8.0         Protein  NEGATIVE   NEG mg/dL       Glucose  NEGATIVE   NEG mg/dL       Ketone  NEGATIVE   NEG mg/dL  Bilirubin  NEGATIVE   NEG         Blood  NEGATIVE   NEG         Urobilinogen  0.2  0.2 - 1.0 EU/dL       Nitrites  NEGATIVE   NEG              Leukocyte Esterase  NEGATIVE   NEG             Radiologic  Studies -      CT ABD PELV W CONT       Final Result     IMPRESSION:          Status post cholecystectomy with minimal stranding in the gallbladder fossa     without definable fluid collection. Small amounts of scattered free     intraperitoneal air. These are all expected postoperative findings. Any concern     for infection or reformations based on clinical setting.            XR CHEST PORT    (Results Pending)          CT Results   (Last 48 hours)                                    01/31/18 0143    CT ABD PELV W CONT  Final result            Impression:    IMPRESSION:             Status post cholecystectomy with minimal stranding in the gallbladder fossa      without definable fluid collection. Small amounts of scattered free      intraperitoneal air. These are all expected postoperative findings. Any concern      for infection or reformations based on clinical setting.                       Narrative:    EXAMINATION: CT abdomen/pelvis with IV contrast             INDICATION: Postoperative infection, recent cholecystectomy             COMPARISON: None             TECHNIQUE: CT of the abdomen and pelvis performed following 100 cc IV Isovue-300      with multiplanar reformations. All CT scans at this facility are performed using      dose optimization technique as appropriate to a performed exam, to include      automated exposure control, adjustment of the mA and/or kV according to patient      size (including appropriate matching first site specific examinations), or use      of iterative reconstruction technique.             FINDINGS:             Lower thorax: Notable dependent atelectasis.             Hepatobiliary: Liver normal. Status post cholecystectomy with minimal stranding      in the gallbladder fossa. No biliary duct dilatation.             Pancreas: Normal.             Spleen: Normal.             Adrenal glands: Normal.  Genitourinary: Right kidney normal. Left kidney normal. Bladder  unremarkable.      Prostate unremarkable.             Gastrointestinal: Stomach unremarkable. Small bowel loops are nondilated. The      colon is nondilated. Appendix normal.             Mesentery/vessels/nodes: Multiple small foci of free intraperitoneal air. No      free fluid. Major vessels unremarkable. No adenopathy by size criteria.      Superficial soft tissues unremarkable.             Bones: No acute osseous findings.                                 CXR Results   (Last 48 hours)          None                       Medical Decision Making     I am the first provider for this patient.      I reviewed the vital signs, available nursing notes, past medical history, past surgical history, family history and social history.      Vital Signs-Reviewed the patient's vital signs.      Records Reviewed: Nursing Notes and Old Medical Records      Procedures:   Procedures      Provider Notes (Medical Decision Making): Nothing acute on the CT scan possibly some developing atelectasis  in his right lower lobe.  We will have him use incentive spirometer and give him 1 more dose of medications.  Patient says that he is a high tolerance for pain medication and is asking to be "pumped full of something so he can just be knocked out when  he gets back to the center".        MED RECONCILIATION:     Current Facility-Administered Medications          Medication  Dose  Route  Frequency           ?  morphine injection 4 mg   4 mg  IntraVENous  NOW           ?  0.9% sodium chloride infusion   100 mL/hr  IntraVENous  CONTINUOUS          Current Outpatient Medications        Medication  Sig         ?  HYDROmorphone (DILAUDID) 4 mg tablet  Take 1 Tab by mouth every six (6) hours as needed for Pain for up to 7 days. Max Daily Amount: 16 mg. Indications: severe pain     ?  docusate sodium (COLACE) 50 mg capsule  Take 1 Cap by mouth two (2) times a day for 90 days.     ?  ibuprofen (MOTRIN) 800 mg tablet  Take 1 Tab by mouth three (3)  times daily as needed for Pain.     ?  ARIPiprazole (ABILIFY) 10 mg tablet  Take 10 mg by mouth daily.     ?  citalopram (CELEXA) 20 mg tablet  Take 20 mg by mouth daily.     ?  gabapentin (NEURONTIN) 300 mg capsule  Take 300 mg by mouth three (3) times daily.     ?  OLANZapine (ZYPREXA) 10 mg tablet  Take 10 mg by  mouth nightly.     ?  clonazePAM (KLONOPIN) 0.5 mg tablet  Take 0.5 mg by mouth two (2) times a day.         ?  ondansetron hcl (ZOFRAN) 4 mg tablet  Take 1 Tab by mouth every eight (8) hours as needed for Nausea.           Disposition:   home      DISCHARGE NOTE:    2:45 AM      Pt has been reexamined.  Patient has no new complaints, changes, or physical findings.  Care plan outlined and precautions discussed.  Results of labs, CXR, CT were reviewed with the patient. All medications were reviewed with the patient. All of pt's  questions and concerns were addressed. Patient was instructed and agrees to follow up with general surgery, as well as to return to the ED upon further deterioration. Patient is ready to go home.        Follow-up Information               Follow up With  Specialties  Details  Why  Contact Info              Lia Hopping, MD  General Surgery  Schedule an appointment as soon as possible for a visit in 2 days    [place] [address]   [phone]                 [facility]  Emergency Medicine    If symptoms worsen return immediately  [place]   [phone]                  Current Discharge Medication List                        Diagnosis        Clinical Impression:       1.  Postoperative abdominal pain      2.  SOB (shortness of breath)         3.  Nausea without vomiting

## 2018-01-30 NOTE — ED Notes (Signed)
Per EMS, patient was here 2 days ago for gall bladder removal ,pt states pain meds not working , complaints of abdominal pain

## 2018-01-31 ENCOUNTER — Emergency Department: Admit: 2018-01-31 | Payer: MEDICAID

## 2018-01-31 ENCOUNTER — Inpatient Hospital Stay
Admit: 2018-01-31 | Discharge: 2018-01-31 | Disposition: A | Discharge status: Home / Self Care | Payer: MEDICAID | Attending: Emergency Medicine

## 2018-01-31 LAB — CBC WITH AUTOMATED DIFF
ABS. BASOPHILS: 0 10*3/uL (ref 0.0–0.1)
ABS. EOSINOPHILS: 0.4 10*3/uL (ref 0.0–0.4)
ABS. LYMPHOCYTES: 3.2 10*3/uL (ref 0.9–3.6)
ABS. MONOCYTES: 1 10*3/uL (ref 0.05–1.2)
ABS. NEUTROPHILS: 5.4 10*3/uL (ref 1.8–8.0)
BASOPHILS: 0 % (ref 0–2)
EOSINOPHILS: 4 % (ref 0–5)
HCT: 39.3 % (ref 36.0–48.0)
HGB: 13.2 g/dL (ref 13.0–16.0)
LYMPHOCYTES: 32 % (ref 21–52)
MCH: 29.2 PG (ref 24.0–34.0)
MCHC: 33.6 g/dL (ref 31.0–37.0)
MCV: 86.9 FL (ref 74.0–97.0)
MONOCYTES: 10 % (ref 3–10)
MPV: 10.5 FL (ref 9.2–11.8)
NEUTROPHILS: 54 % (ref 40–73)
PLATELET: 248 10*3/uL (ref 135–420)
RBC: 4.52 M/uL — ABNORMAL LOW (ref 4.70–5.50)
RDW: 16.1 % — ABNORMAL HIGH (ref 11.6–14.5)
WBC: 9.9 10*3/uL (ref 4.6–13.2)

## 2018-01-31 LAB — LIPASE
Lipase: 88 U/L (ref 73–393)
Lipase: 88 U/L (ref 73–393)

## 2018-01-31 LAB — METABOLIC PANEL, COMPREHENSIVE
A-G Ratio: 1 (ref 0.8–1.7)
ALT (SGPT): 131 U/L — ABNORMAL HIGH (ref 16–61)
AST (SGOT): 55 U/L — ABNORMAL HIGH (ref 10–38)
Albumin: 3.4 g/dL (ref 3.4–5.0)
Alk. phosphatase: 166 U/L — ABNORMAL HIGH (ref 45–117)
Anion gap: 4 mmol/L (ref 3.0–18)
BUN/Creatinine ratio: 9 — ABNORMAL LOW (ref 12–20)
BUN: 9 MG/DL (ref 7.0–18)
Bilirubin, total: 0.2 MG/DL (ref 0.2–1.0)
CO2: 27 mmol/L (ref 21–32)
Calcium: 8.8 MG/DL (ref 8.5–10.1)
Chloride: 110 mmol/L (ref 100–111)
Creatinine: 1 MG/DL (ref 0.6–1.3)
GFR est AA: >60 mL/min/{1.73_m2} (ref >60)
GFR est non-AA: >60 mL/min/{1.73_m2} (ref >60)
Globulin: 3.5 g/dL (ref 2.0–4.0)
Glucose: 102 mg/dL — ABNORMAL HIGH (ref 74–99)
Potassium: 4.1 mmol/L (ref 3.5–5.5)
Protein, total: 6.9 g/dL (ref 6.4–8.2)
Sodium: 141 mmol/L (ref 136–145)

## 2018-01-31 LAB — URINALYSIS W/ RFLX MICROSCOPIC
Bilirubin, Urine: NEGATIVE
Bilirubin: NEGATIVE
Blood, Urine: NEGATIVE
Blood: NEGATIVE
Glucose, Ur: NEGATIVE mg/dL
Glucose: NEGATIVE mg/dL
Ketone: NEGATIVE mg/dL
Ketones, Urine: NEGATIVE mg/dL
Leukocyte Esterase, Urine: NEGATIVE
Leukocyte Esterase: NEGATIVE
Nitrite, Urine: NEGATIVE
Nitrites: NEGATIVE
Protein, UA: NEGATIVE mg/dL
Protein: NEGATIVE mg/dL
Specific Gravity, UA: 1.011 (ref 1.005–1.030)
Specific gravity: 1.011 (ref 1.005–1.030)
Urobilinogen, UA, POCT: 0.2 EU/dL (ref 0.2–1.0)
Urobilinogen: 0.2 EU/dL (ref 0.2–1.0)
pH (UA): 8.5 — ABNORMAL HIGH (ref 5.0–8.0)
pH, UA: 8.5 — ABNORMAL HIGH (ref 5.0–8.0)

## 2018-01-31 LAB — CBC WITH AUTO DIFFERENTIAL
Basophils %: 0 % (ref 0–2)
Basophils Absolute: 0 10*3/uL (ref 0.0–0.1)
Eosinophils %: 4 % (ref 0–5)
Eosinophils Absolute: 0.4 10*3/uL (ref 0.0–0.4)
Hematocrit: 39.3 % (ref 36.0–48.0)
Hemoglobin: 13.2 g/dL (ref 13.0–16.0)
Lymphocytes %: 32 % (ref 21–52)
Lymphocytes Absolute: 3.2 10*3/uL (ref 0.9–3.6)
MCH: 29.2 PG (ref 24.0–34.0)
MCHC: 33.6 g/dL (ref 31.0–37.0)
MCV: 86.9 FL (ref 74.0–97.0)
MPV: 10.5 FL (ref 9.2–11.8)
Monocytes %: 10 % (ref 3–10)
Monocytes Absolute: 1 10*3/uL (ref 0.05–1.2)
Neutrophils %: 54 % (ref 40–73)
Neutrophils Absolute: 5.4 10*3/uL (ref 1.8–8.0)
Platelets: 248 10*3/uL (ref 135–420)
RBC: 4.52 M/uL — ABNORMAL LOW (ref 4.70–5.50)
RDW: 16.1 % — ABNORMAL HIGH (ref 11.6–14.5)
WBC: 9.9 10*3/uL (ref 4.6–13.2)

## 2018-01-31 LAB — COMPREHENSIVE METABOLIC PANEL
ALT: 131 U/L — ABNORMAL HIGH (ref 16–61)
AST: 55 U/L — ABNORMAL HIGH (ref 10–38)
Albumin/Globulin Ratio: 1 (ref 0.8–1.7)
Albumin: 3.4 g/dL (ref 3.4–5.0)
Alkaline Phosphatase: 166 U/L — ABNORMAL HIGH (ref 45–117)
Anion Gap: 4 mmol/L (ref 3.0–18)
BUN: 9 MG/DL (ref 7.0–18)
Bun/Cre Ratio: 9 — ABNORMAL LOW (ref 12–20)
CO2: 27 mmol/L (ref 21–32)
Calcium: 8.8 MG/DL (ref 8.5–10.1)
Chloride: 110 mmol/L (ref 100–111)
Creatinine: 1 MG/DL (ref 0.6–1.3)
EGFR IF NonAfrican American: >60 mL/min/{1.73_m2} (ref >60)
GFR African American: >60 mL/min/{1.73_m2} (ref >60)
Globulin: 3.5 g/dL (ref 2.0–4.0)
Glucose: 102 mg/dL — ABNORMAL HIGH (ref 74–99)
Potassium: 4.1 mmol/L (ref 3.5–5.5)
Sodium: 141 mmol/L (ref 136–145)
Total Bilirubin: 0.2 MG/DL (ref 0.2–1.0)
Total Protein: 6.9 g/dL (ref 6.4–8.2)

## 2018-01-31 MED ORDER — SODIUM CHLORIDE 0.9% BOLUS IV
0.9 % | Freq: Once | INTRAVENOUS | Status: AC
Start: 2018-01-31 — End: 2018-01-31
  Administered 2018-01-31: 05:00:00 via INTRAVENOUS

## 2018-01-31 MED ORDER — IOPAMIDOL 61 % IV SOLN
300 mg iodine /mL (61 %) | Freq: Once | INTRAVENOUS | Status: AC
Start: 2018-01-31 — End: 2018-01-31
  Administered 2018-01-31: 06:00:00 via INTRAVENOUS

## 2018-01-31 MED ORDER — ONDANSETRON (PF) 4 MG/2 ML INJECTION
4 mg/2 mL | INTRAMUSCULAR | Status: AC
Start: 2018-01-31 — End: 2018-01-31
  Administered 2018-01-31: 05:00:00 via INTRAVENOUS

## 2018-01-31 MED ORDER — SODIUM CHLORIDE 0.9 % IV
INTRAVENOUS | Status: DC
Start: 2018-01-31 — End: 2018-01-31

## 2018-01-31 MED ORDER — MORPHINE 4 MG/ML SYRINGE
4 mg/mL | INTRAMUSCULAR | Status: AC
Start: 2018-01-31 — End: 2018-01-31
  Administered 2018-01-31: 05:00:00 via INTRAVENOUS

## 2018-01-31 MED ORDER — MORPHINE 4 MG/ML SYRINGE
4 mg/mL | INTRAMUSCULAR | Status: AC
Start: 2018-01-31 — End: 2018-01-31
  Administered 2018-01-31: 07:00:00 via INTRAVENOUS

## 2018-01-31 MED FILL — ISOVUE-300  61 % INTRAVENOUS SOLUTION: 300 mg iodine /mL (61 %) | INTRAVENOUS | Qty: 100

## 2018-01-31 MED FILL — MORPHINE 4 MG/ML SYRINGE: 4 mg/mL | INTRAMUSCULAR | Qty: 1

## 2018-01-31 MED FILL — SODIUM CHLORIDE 0.9 % IV: INTRAVENOUS | Qty: 1000

## 2018-01-31 MED FILL — ONDANSETRON (PF) 4 MG/2 ML INJECTION: 4 mg/2 mL | INTRAMUSCULAR | Qty: 2

## 2018-01-31 NOTE — ED Notes (Signed)
I have reviewed discharge instructions with the patient.  The patient verbalized understanding. Patient armband removed and given to patient to take home.  Patient was informed of the privacy risks if armband lost or stolen.  PT ambulatory to lobby.

## 2018-01-31 NOTE — ED Notes (Signed)
Spoke with Yolanda from [facility], ok to discharge pt and have wait in the lobby for transport back to facility.

## 2018-01-31 NOTE — ED Notes (Signed)
I have reviewed discharge instructions with the patient.  The patient verbalized understanding. Patient armband removed and given to patient to take home.  Patient was informed of the privacy risks if armband lost or stolen.  PT ambulatory to lobby.

## 2018-01-31 NOTE — ED Notes (Signed)
Spoke with Yolanda from [facility], ok to discharge pt and have wait in the lobby for transport back to facility.
